# Patient Record
Sex: Male | Born: 1950 | Race: White | Hispanic: No | Smoking: Former smoker
Health system: Southern US, Community
[De-identification: ages and names within clinical notes are randomized; demographics above are authoritative.]

## PROBLEM LIST (undated history)

## (undated) DIAGNOSIS — S5292XA Unspecified fracture of left forearm, initial encounter for closed fracture: Secondary | ICD-10-CM

## (undated) DIAGNOSIS — K219 Gastro-esophageal reflux disease without esophagitis: Secondary | ICD-10-CM

## (undated) DIAGNOSIS — N200 Calculus of kidney: Secondary | ICD-10-CM

## (undated) DIAGNOSIS — C801 Malignant (primary) neoplasm, unspecified: Secondary | ICD-10-CM

## (undated) DIAGNOSIS — F32A Depression, unspecified: Secondary | ICD-10-CM

## (undated) DIAGNOSIS — M1990 Unspecified osteoarthritis, unspecified site: Secondary | ICD-10-CM

## (undated) DIAGNOSIS — Z87442 Personal history of urinary calculi: Secondary | ICD-10-CM

## (undated) DIAGNOSIS — F329 Major depressive disorder, single episode, unspecified: Secondary | ICD-10-CM

## (undated) HISTORY — PX: TONSILLECTOMY: SUR1361

## (undated) HISTORY — PX: COLONOSCOPY: SHX174

## (undated) HISTORY — DX: Gastro-esophageal reflux disease without esophagitis: K21.9

## (undated) HISTORY — PX: UPPER GI ENDOSCOPY: SHX6162

## (undated) HISTORY — PX: APPENDECTOMY: SHX54

---

## 1999-04-03 HISTORY — PX: LITHOTRIPSY: SUR834

## 1999-04-03 HISTORY — PX: CYSTOSCOPY W/ URETERAL STENT PLACEMENT: SHX1429

## 1999-04-12 ENCOUNTER — Encounter: Payer: Self-pay | Admitting: Urology

## 1999-04-12 ENCOUNTER — Ambulatory Visit (HOSPITAL_COMMUNITY): Admission: RE | Admit: 1999-04-12 | Discharge: 1999-04-13 | Payer: Self-pay | Admitting: Urology

## 1999-04-12 ENCOUNTER — Emergency Department (HOSPITAL_COMMUNITY): Admission: EM | Admit: 1999-04-12 | Discharge: 1999-04-12 | Payer: Self-pay | Admitting: Emergency Medicine

## 1999-04-12 ENCOUNTER — Encounter: Payer: Self-pay | Admitting: Emergency Medicine

## 1999-04-14 ENCOUNTER — Encounter: Payer: Self-pay | Admitting: Urology

## 1999-04-14 ENCOUNTER — Ambulatory Visit (HOSPITAL_COMMUNITY): Admission: RE | Admit: 1999-04-14 | Discharge: 1999-04-14 | Payer: Self-pay | Admitting: Urology

## 1999-04-15 ENCOUNTER — Emergency Department (HOSPITAL_COMMUNITY): Admission: EM | Admit: 1999-04-15 | Discharge: 1999-04-15 | Payer: Self-pay | Admitting: Emergency Medicine

## 1999-04-15 ENCOUNTER — Encounter: Payer: Self-pay | Admitting: Emergency Medicine

## 2000-12-09 ENCOUNTER — Emergency Department (HOSPITAL_COMMUNITY): Admission: EM | Admit: 2000-12-09 | Discharge: 2000-12-09 | Payer: Self-pay | Admitting: Emergency Medicine

## 2002-03-02 ENCOUNTER — Emergency Department (HOSPITAL_COMMUNITY): Admission: EM | Admit: 2002-03-02 | Discharge: 2002-03-02 | Payer: Self-pay | Admitting: Emergency Medicine

## 2007-02-08 ENCOUNTER — Encounter: Admission: RE | Admit: 2007-02-08 | Discharge: 2007-02-08 | Payer: Self-pay | Admitting: Orthopedic Surgery

## 2007-02-20 ENCOUNTER — Ambulatory Visit: Payer: Self-pay | Admitting: Internal Medicine

## 2007-02-22 ENCOUNTER — Encounter: Admission: RE | Admit: 2007-02-22 | Discharge: 2007-02-22 | Payer: Self-pay | Admitting: Orthopedic Surgery

## 2007-02-26 LAB — COMPREHENSIVE METABOLIC PANEL
Albumin: 4.4 g/dL (ref 3.5–5.2)
BUN: 14 mg/dL (ref 6–23)
Calcium: 9.1 mg/dL (ref 8.4–10.5)
Chloride: 107 mEq/L (ref 96–112)
Glucose, Bld: 122 mg/dL — ABNORMAL HIGH (ref 70–99)
Potassium: 3.9 mEq/L (ref 3.5–5.3)
Sodium: 141 mEq/L (ref 135–145)
Total Protein: 7.2 g/dL (ref 6.0–8.3)

## 2007-02-26 LAB — CBC WITH DIFFERENTIAL/PLATELET
Basophils Absolute: 0 10*3/uL (ref 0.0–0.1)
Eosinophils Absolute: 0.1 10*3/uL (ref 0.0–0.5)
HGB: 14.8 g/dL (ref 13.0–17.1)
MONO#: 0.4 10*3/uL (ref 0.1–0.9)
NEUT#: 4.7 10*3/uL (ref 1.5–6.5)
RDW: 12.8 % (ref 11.2–14.6)
WBC: 7.1 10*3/uL (ref 4.0–10.0)
lymph#: 1.8 10*3/uL (ref 0.9–3.3)

## 2007-02-26 LAB — URIC ACID: Uric Acid, Serum: 4.2 mg/dL (ref 4.0–7.8)

## 2007-03-01 ENCOUNTER — Ambulatory Visit (HOSPITAL_COMMUNITY): Admission: RE | Admit: 2007-03-01 | Discharge: 2007-03-01 | Payer: Self-pay | Admitting: Internal Medicine

## 2007-03-11 ENCOUNTER — Ambulatory Visit: Payer: Self-pay | Admitting: Vascular Surgery

## 2007-03-28 ENCOUNTER — Ambulatory Visit: Payer: Self-pay | Admitting: Vascular Surgery

## 2007-04-03 ENCOUNTER — Ambulatory Visit (HOSPITAL_BASED_OUTPATIENT_CLINIC_OR_DEPARTMENT_OTHER): Admission: RE | Admit: 2007-04-03 | Discharge: 2007-04-03 | Payer: Self-pay | Admitting: General Surgery

## 2007-04-03 ENCOUNTER — Encounter (INDEPENDENT_AMBULATORY_CARE_PROVIDER_SITE_OTHER): Payer: Self-pay | Admitting: General Surgery

## 2007-04-03 HISTORY — PX: AXILLARY LYMPH NODE BIOPSY: SHX5737

## 2007-04-04 ENCOUNTER — Ambulatory Visit: Payer: Self-pay | Admitting: Internal Medicine

## 2007-12-09 ENCOUNTER — Emergency Department (HOSPITAL_COMMUNITY): Admission: EM | Admit: 2007-12-09 | Discharge: 2007-12-09 | Payer: Self-pay | Admitting: Emergency Medicine

## 2008-03-02 HISTORY — PX: AXILLARY LYMPH NODE BIOPSY: SHX5737

## 2008-03-11 ENCOUNTER — Encounter: Admission: RE | Admit: 2008-03-11 | Discharge: 2008-03-11 | Payer: Self-pay | Admitting: Gastroenterology

## 2008-03-18 ENCOUNTER — Encounter (INDEPENDENT_AMBULATORY_CARE_PROVIDER_SITE_OTHER): Payer: Self-pay | Admitting: General Surgery

## 2008-03-19 ENCOUNTER — Ambulatory Visit (HOSPITAL_COMMUNITY): Admission: RE | Admit: 2008-03-19 | Discharge: 2008-03-19 | Payer: Self-pay | Admitting: Orthopedic Surgery

## 2008-03-19 ENCOUNTER — Encounter (INDEPENDENT_AMBULATORY_CARE_PROVIDER_SITE_OTHER): Payer: Self-pay | Admitting: General Surgery

## 2008-04-02 ENCOUNTER — Ambulatory Visit: Payer: Self-pay | Admitting: Internal Medicine

## 2008-04-02 LAB — CBC WITH DIFFERENTIAL/PLATELET
BASO%: 0.3 % (ref 0.0–2.0)
Basophils Absolute: 0 10*3/uL (ref 0.0–0.1)
EOS%: 0.7 % (ref 0.0–7.0)
HCT: 37.2 % — ABNORMAL LOW (ref 38.4–49.9)
HGB: 12.6 g/dL — ABNORMAL LOW (ref 13.0–17.1)
MCH: 27.5 pg (ref 27.2–33.4)
MCHC: 33.9 g/dL (ref 32.0–36.0)
MCV: 81.2 fL (ref 79.3–98.0)
MONO%: 6.8 % (ref 0.0–14.0)
NEUT%: 69.1 % (ref 39.0–75.0)
RDW: 13 % (ref 11.0–14.6)

## 2008-04-02 LAB — COMPREHENSIVE METABOLIC PANEL
AST: 23 U/L (ref 0–37)
Alkaline Phosphatase: 61 U/L (ref 39–117)
BUN: 15 mg/dL (ref 6–23)
Glucose, Bld: 187 mg/dL — ABNORMAL HIGH (ref 70–99)
Total Bilirubin: 0.5 mg/dL (ref 0.3–1.2)

## 2008-04-08 LAB — CBC WITH DIFFERENTIAL/PLATELET
Basophils Absolute: 0 10*3/uL (ref 0.0–0.1)
Eosinophils Absolute: 0 10*3/uL (ref 0.0–0.5)
HCT: 35.4 % — ABNORMAL LOW (ref 38.4–49.9)
HGB: 12.4 g/dL — ABNORMAL LOW (ref 13.0–17.1)
LYMPH%: 29.4 % (ref 14.0–49.0)
MCV: 82 fL (ref 79.3–98.0)
MONO%: 7.5 % (ref 0.0–14.0)
NEUT#: 1.4 10*3/uL — ABNORMAL LOW (ref 1.5–6.5)
NEUT%: 61.8 % (ref 39.0–75.0)
Platelets: 21 10*3/uL — ABNORMAL LOW (ref 140–400)

## 2008-04-08 LAB — COMPREHENSIVE METABOLIC PANEL
BUN: 13 mg/dL (ref 6–23)
CO2: 21 mEq/L (ref 19–32)
Calcium: 9 mg/dL (ref 8.4–10.5)
Creatinine, Ser: 1.04 mg/dL (ref 0.40–1.50)
Glucose, Bld: 106 mg/dL — ABNORMAL HIGH (ref 70–99)
Total Bilirubin: 0.5 mg/dL (ref 0.3–1.2)

## 2008-04-08 LAB — LACTATE DEHYDROGENASE: LDH: 278 U/L — ABNORMAL HIGH (ref 94–250)

## 2008-04-15 LAB — COMPREHENSIVE METABOLIC PANEL
ALT: 8 U/L (ref 0–53)
AST: 15 U/L (ref 0–37)
Albumin: 3.9 g/dL (ref 3.5–5.2)
CO2: 22 mEq/L (ref 19–32)
Calcium: 9 mg/dL (ref 8.4–10.5)
Chloride: 104 mEq/L (ref 96–112)
Creatinine, Ser: 1.14 mg/dL (ref 0.40–1.50)
Potassium: 3.9 mEq/L (ref 3.5–5.3)

## 2008-04-15 LAB — CBC WITH DIFFERENTIAL/PLATELET
BASO%: 0.3 % (ref 0.0–2.0)
Basophils Absolute: 0 10*3/uL (ref 0.0–0.1)
HCT: 37.9 % — ABNORMAL LOW (ref 38.4–49.9)
HGB: 13.4 g/dL (ref 13.0–17.1)
MCHC: 35.4 g/dL (ref 32.0–36.0)
MONO#: 0.3 10*3/uL (ref 0.1–0.9)
NEUT%: 66.7 % (ref 39.0–75.0)
RDW: 12.9 % (ref 11.0–14.6)
WBC: 2.9 10*3/uL — ABNORMAL LOW (ref 4.0–10.3)
lymph#: 0.6 10*3/uL — ABNORMAL LOW (ref 0.9–3.3)

## 2008-04-15 LAB — LACTATE DEHYDROGENASE: LDH: 265 U/L — ABNORMAL HIGH (ref 94–250)

## 2008-04-21 LAB — COMPREHENSIVE METABOLIC PANEL
AST: 12 U/L (ref 0–37)
Alkaline Phosphatase: 50 U/L (ref 39–117)
BUN: 12 mg/dL (ref 6–23)
Creatinine, Ser: 1.03 mg/dL (ref 0.40–1.50)
Glucose, Bld: 101 mg/dL — ABNORMAL HIGH (ref 70–99)
Potassium: 4.1 mEq/L (ref 3.5–5.3)
Total Bilirubin: 0.4 mg/dL (ref 0.3–1.2)

## 2008-04-21 LAB — CBC WITH DIFFERENTIAL/PLATELET
BASO%: 0.8 % (ref 0.0–2.0)
Basophils Absolute: 0 10*3/uL (ref 0.0–0.1)
EOS%: 0.8 % (ref 0.0–7.0)
HGB: 13.3 g/dL (ref 13.0–17.1)
MCH: 27.9 pg (ref 27.2–33.4)
MCHC: 34.9 g/dL (ref 32.0–36.0)
MCV: 79.9 fL (ref 79.3–98.0)
MONO%: 10.2 % (ref 0.0–14.0)
RBC: 4.77 10*6/uL (ref 4.20–5.82)
RDW: 12.9 % (ref 11.0–14.6)
lymph#: 0.7 10*3/uL — ABNORMAL LOW (ref 0.9–3.3)
nRBC: 0 % (ref 0–0)

## 2008-04-29 LAB — CBC WITH DIFFERENTIAL/PLATELET
BASO%: 0.5 % (ref 0.0–2.0)
LYMPH%: 17.9 % (ref 14.0–49.0)
MCHC: 35.3 g/dL (ref 32.0–36.0)
MONO#: 0.5 10*3/uL (ref 0.1–0.9)
NEUT#: 4 10*3/uL (ref 1.5–6.5)
Platelets: 87 10*3/uL — ABNORMAL LOW (ref 140–400)
RBC: 5.02 10*6/uL (ref 4.20–5.82)
RDW: 13.4 % (ref 11.0–14.6)
WBC: 5.5 10*3/uL (ref 4.0–10.3)

## 2008-04-29 LAB — COMPREHENSIVE METABOLIC PANEL
Albumin: 4 g/dL (ref 3.5–5.2)
Alkaline Phosphatase: 53 U/L (ref 39–117)
BUN: 11 mg/dL (ref 6–23)
CO2: 22 mEq/L (ref 19–32)
Calcium: 9.1 mg/dL (ref 8.4–10.5)
Chloride: 106 mEq/L (ref 96–112)
Glucose, Bld: 104 mg/dL — ABNORMAL HIGH (ref 70–99)
Potassium: 3.8 mEq/L (ref 3.5–5.3)
Sodium: 138 mEq/L (ref 135–145)
Total Protein: 6.4 g/dL (ref 6.0–8.3)

## 2008-04-29 LAB — LACTATE DEHYDROGENASE: LDH: 188 U/L (ref 94–250)

## 2008-05-07 ENCOUNTER — Ambulatory Visit: Admission: RE | Admit: 2008-05-07 | Discharge: 2008-05-07 | Payer: Self-pay | Admitting: Internal Medicine

## 2008-05-07 ENCOUNTER — Encounter: Payer: Self-pay | Admitting: Internal Medicine

## 2008-05-11 LAB — CBC WITH DIFFERENTIAL/PLATELET
BASO%: 1 % (ref 0.0–2.0)
Basophils Absolute: 0 10*3/uL (ref 0.0–0.1)
EOS%: 1.3 % (ref 0.0–7.0)
Eosinophils Absolute: 0 10*3/uL (ref 0.0–0.5)
HCT: 37.3 % — ABNORMAL LOW (ref 38.4–49.9)
HGB: 13.1 g/dL (ref 13.0–17.1)
LYMPH%: 25.9 % (ref 14.0–49.0)
MCH: 28.2 pg (ref 27.2–33.4)
MCHC: 35.1 g/dL (ref 32.0–36.0)
MCV: 80.2 fL (ref 79.3–98.0)
MONO#: 0.4 10*3/uL (ref 0.1–0.9)
MONO%: 12.3 % (ref 0.0–14.0)
NEUT#: 1.8 10*3/uL (ref 1.5–6.5)
NEUT%: 59.5 % (ref 39.0–75.0)
Platelets: 66 10*3/uL — ABNORMAL LOW (ref 140–400)
RBC: 4.65 10*6/uL (ref 4.20–5.82)
RDW: 13.8 % (ref 11.0–14.6)
WBC: 3.1 10*3/uL — ABNORMAL LOW (ref 4.0–10.3)
lymph#: 0.8 10*3/uL — ABNORMAL LOW (ref 0.9–3.3)
nRBC: 0 % (ref 0–0)

## 2008-05-11 LAB — COMPREHENSIVE METABOLIC PANEL
ALT: 8 U/L (ref 0–53)
AST: 16 U/L (ref 0–37)
Albumin: 3.8 g/dL (ref 3.5–5.2)
Calcium: 9.1 mg/dL (ref 8.4–10.5)
Chloride: 107 mEq/L (ref 96–112)
Potassium: 3.8 mEq/L (ref 3.5–5.3)
Sodium: 141 mEq/L (ref 135–145)

## 2008-05-18 ENCOUNTER — Ambulatory Visit: Payer: Self-pay | Admitting: Internal Medicine

## 2008-05-18 LAB — COMPREHENSIVE METABOLIC PANEL
ALT: 14 U/L (ref 0–53)
AST: 14 U/L (ref 0–37)
Calcium: 9.4 mg/dL (ref 8.4–10.5)
Chloride: 101 mEq/L (ref 96–112)
Creatinine, Ser: 0.97 mg/dL (ref 0.40–1.50)
Potassium: 3.6 mEq/L (ref 3.5–5.3)
Sodium: 137 mEq/L (ref 135–145)
Total Protein: 6.5 g/dL (ref 6.0–8.3)

## 2008-05-18 LAB — CBC WITH DIFFERENTIAL/PLATELET
BASO%: 1.6 % (ref 0.0–2.0)
EOS%: 10.3 % — ABNORMAL HIGH (ref 0.0–7.0)
MCH: 28.4 pg (ref 27.2–33.4)
MCHC: 34.8 g/dL (ref 32.0–36.0)
NEUT%: 12.7 % — ABNORMAL LOW (ref 39.0–75.0)
RBC: 4.55 10*6/uL (ref 4.20–5.82)
RDW: 13.6 % (ref 11.0–14.6)
WBC: 0.7 10*3/uL — CL (ref 4.0–10.3)
lymph#: 0.4 10*3/uL — ABNORMAL LOW (ref 0.9–3.3)

## 2008-05-18 LAB — URINALYSIS, MICROSCOPIC - CHCC
Ketones: NEGATIVE mg/dL
Protein: 30 mg/dL
pH: 7 (ref 4.6–8.0)

## 2008-05-25 LAB — COMPREHENSIVE METABOLIC PANEL
ALT: 11 U/L (ref 0–53)
AST: 14 U/L (ref 0–37)
CO2: 23 mEq/L (ref 19–32)
Calcium: 8.8 mg/dL (ref 8.4–10.5)
Chloride: 105 mEq/L (ref 96–112)
Creatinine, Ser: 1.08 mg/dL (ref 0.40–1.50)
Sodium: 136 mEq/L (ref 135–145)
Total Protein: 6.2 g/dL (ref 6.0–8.3)

## 2008-05-25 LAB — CBC WITH DIFFERENTIAL/PLATELET
Basophils Absolute: 0 10*3/uL (ref 0.0–0.1)
HCT: 31.5 % — ABNORMAL LOW (ref 38.4–49.9)
HGB: 11.1 g/dL — ABNORMAL LOW (ref 13.0–17.1)
MCH: 28.6 pg (ref 27.2–33.4)
MONO#: 0.6 10*3/uL (ref 0.1–0.9)
NEUT%: 76.2 % — ABNORMAL HIGH (ref 39.0–75.0)
WBC: 8.6 10*3/uL (ref 4.0–10.3)
lymph#: 1.4 10*3/uL (ref 0.9–3.3)

## 2008-05-25 LAB — LACTATE DEHYDROGENASE: LDH: 177 U/L (ref 94–250)

## 2008-06-02 LAB — CBC WITH DIFFERENTIAL/PLATELET
Basophils Absolute: 0 10*3/uL (ref 0.0–0.1)
Eosinophils Absolute: 0 10*3/uL (ref 0.0–0.5)
HCT: 32.5 % — ABNORMAL LOW (ref 38.4–49.9)
HGB: 11.6 g/dL — ABNORMAL LOW (ref 13.0–17.1)
LYMPH%: 18.9 % (ref 14.0–49.0)
MCHC: 35.7 g/dL (ref 32.0–36.0)
MONO#: 0.6 10*3/uL (ref 0.1–0.9)
NEUT#: 4.1 10*3/uL (ref 1.5–6.5)
NEUT%: 69.7 % (ref 39.0–75.0)
Platelets: 155 10*3/uL (ref 140–400)
WBC: 5.9 10*3/uL (ref 4.0–10.3)
lymph#: 1.1 10*3/uL (ref 0.9–3.3)

## 2008-06-10 LAB — CBC WITH DIFFERENTIAL/PLATELET
BASO%: 1.9 % (ref 0.0–2.0)
Basophils Absolute: 0 10*3/uL (ref 0.0–0.1)
EOS%: 5.7 % (ref 0.0–7.0)
HCT: 31.6 % — ABNORMAL LOW (ref 38.4–49.9)
HGB: 11.7 g/dL — ABNORMAL LOW (ref 13.0–17.1)
LYMPH%: 68.9 % — ABNORMAL HIGH (ref 14.0–49.0)
MCH: 30.4 pg (ref 27.2–33.4)
MCHC: 36.9 g/dL — ABNORMAL HIGH (ref 32.0–36.0)
MCV: 82.5 fL (ref 79.3–98.0)
NEUT%: 11 % — ABNORMAL LOW (ref 39.0–75.0)
Platelets: 60 10*3/uL — ABNORMAL LOW (ref 140–400)
lymph#: 0.6 10*3/uL — ABNORMAL LOW (ref 0.9–3.3)

## 2008-06-10 LAB — COMPREHENSIVE METABOLIC PANEL
ALT: 18 U/L (ref 0–53)
AST: 11 U/L (ref 0–37)
BUN: 12 mg/dL (ref 6–23)
CO2: 27 mEq/L (ref 19–32)
Calcium: 8.9 mg/dL (ref 8.4–10.5)
Chloride: 104 mEq/L (ref 96–112)
Creatinine, Ser: 0.89 mg/dL (ref 0.40–1.50)
Total Bilirubin: 0.6 mg/dL (ref 0.3–1.2)

## 2008-06-10 LAB — LACTATE DEHYDROGENASE: LDH: 100 U/L (ref 94–250)

## 2008-06-17 LAB — CBC WITH DIFFERENTIAL/PLATELET
BASO%: 0.6 % (ref 0.0–2.0)
Basophils Absolute: 0 10*3/uL (ref 0.0–0.1)
HCT: 33.3 % — ABNORMAL LOW (ref 38.4–49.9)
HGB: 12.2 g/dL — ABNORMAL LOW (ref 13.0–17.1)
LYMPH%: 27.1 % (ref 14.0–49.0)
MCH: 30.9 pg (ref 27.2–33.4)
MCHC: 36.8 g/dL — ABNORMAL HIGH (ref 32.0–36.0)
MONO#: 0.7 10*3/uL (ref 0.1–0.9)
NEUT%: 59.6 % (ref 39.0–75.0)
Platelets: 135 10*3/uL — ABNORMAL LOW (ref 140–400)
WBC: 5.6 10*3/uL (ref 4.0–10.3)
lymph#: 1.5 10*3/uL (ref 0.9–3.3)

## 2008-06-17 LAB — LACTATE DEHYDROGENASE: LDH: 142 U/L (ref 94–250)

## 2008-06-17 LAB — COMPREHENSIVE METABOLIC PANEL
ALT: 18 U/L (ref 0–53)
BUN: 4 mg/dL — ABNORMAL LOW (ref 6–23)
CO2: 26 mEq/L (ref 19–32)
Calcium: 9 mg/dL (ref 8.4–10.5)
Chloride: 105 mEq/L (ref 96–112)
Creatinine, Ser: 1.04 mg/dL (ref 0.40–1.50)
Total Bilirubin: 0.6 mg/dL (ref 0.3–1.2)

## 2008-06-22 LAB — CBC WITH DIFFERENTIAL/PLATELET
Basophils Absolute: 0 10*3/uL (ref 0.0–0.1)
EOS%: 0.4 % (ref 0.0–7.0)
Eosinophils Absolute: 0 10*3/uL (ref 0.0–0.5)
HCT: 33.4 % — ABNORMAL LOW (ref 38.4–49.9)
HGB: 11.9 g/dL — ABNORMAL LOW (ref 13.0–17.1)
MONO#: 0.6 10*3/uL (ref 0.1–0.9)
NEUT#: 5.8 10*3/uL (ref 1.5–6.5)
NEUT%: 78.6 % — ABNORMAL HIGH (ref 39.0–75.0)
RDW: 18.1 % — ABNORMAL HIGH (ref 11.0–14.6)
WBC: 7.3 10*3/uL (ref 4.0–10.3)
lymph#: 0.9 10*3/uL (ref 0.9–3.3)

## 2008-06-22 LAB — COMPREHENSIVE METABOLIC PANEL
AST: 15 U/L (ref 0–37)
Albumin: 3.6 g/dL (ref 3.5–5.2)
BUN: 5 mg/dL — ABNORMAL LOW (ref 6–23)
CO2: 25 mEq/L (ref 19–32)
Calcium: 9.1 mg/dL (ref 8.4–10.5)
Chloride: 105 mEq/L (ref 96–112)
Creatinine, Ser: 0.95 mg/dL (ref 0.40–1.50)
Glucose, Bld: 118 mg/dL — ABNORMAL HIGH (ref 70–99)
Potassium: 3.7 mEq/L (ref 3.5–5.3)

## 2008-06-22 LAB — LACTATE DEHYDROGENASE: LDH: 147 U/L (ref 94–250)

## 2008-06-29 LAB — CBC WITH DIFFERENTIAL/PLATELET
Basophils Absolute: 0 10*3/uL (ref 0.0–0.1)
EOS%: 4.3 % (ref 0.0–7.0)
Eosinophils Absolute: 0 10*3/uL (ref 0.0–0.5)
HCT: 28.6 % — ABNORMAL LOW (ref 38.4–49.9)
HGB: 10.6 g/dL — ABNORMAL LOW (ref 13.0–17.1)
MCH: 31.4 pg (ref 27.2–33.4)
MONO#: 0.1 10*3/uL (ref 0.1–0.9)
NEUT%: 10.7 % — ABNORMAL LOW (ref 39.0–75.0)
lymph#: 0.5 10*3/uL — ABNORMAL LOW (ref 0.9–3.3)

## 2008-06-29 LAB — COMPREHENSIVE METABOLIC PANEL
BUN: 8 mg/dL (ref 6–23)
CO2: 26 mEq/L (ref 19–32)
Calcium: 9 mg/dL (ref 8.4–10.5)
Chloride: 103 mEq/L (ref 96–112)
Creatinine, Ser: 0.83 mg/dL (ref 0.40–1.50)
Glucose, Bld: 108 mg/dL — ABNORMAL HIGH (ref 70–99)

## 2008-06-29 LAB — LACTATE DEHYDROGENASE: LDH: 110 U/L (ref 94–250)

## 2008-07-07 ENCOUNTER — Ambulatory Visit (HOSPITAL_COMMUNITY): Admission: RE | Admit: 2008-07-07 | Discharge: 2008-07-07 | Payer: Self-pay | Admitting: Internal Medicine

## 2008-07-07 ENCOUNTER — Ambulatory Visit: Payer: Self-pay | Admitting: Internal Medicine

## 2008-07-09 LAB — COMPREHENSIVE METABOLIC PANEL
ALT: 9 U/L (ref 0–53)
AST: 12 U/L (ref 0–37)
Albumin: 3.8 g/dL (ref 3.5–5.2)
CO2: 26 mEq/L (ref 19–32)
Calcium: 9.3 mg/dL (ref 8.4–10.5)
Chloride: 106 mEq/L (ref 96–112)
Potassium: 4.3 mEq/L (ref 3.5–5.3)
Sodium: 141 mEq/L (ref 135–145)
Total Protein: 6.3 g/dL (ref 6.0–8.3)

## 2008-07-09 LAB — CBC WITH DIFFERENTIAL/PLATELET
BASO%: 0.2 % (ref 0.0–2.0)
EOS%: 0.7 % (ref 0.0–7.0)
HCT: 31.4 % — ABNORMAL LOW (ref 38.4–49.9)
MCH: 31.6 pg (ref 27.2–33.4)
MCHC: 35.8 g/dL (ref 32.0–36.0)
MONO#: 0.5 10*3/uL (ref 0.1–0.9)
NEUT%: 76.3 % — ABNORMAL HIGH (ref 39.0–75.0)
RDW: 19.6 % — ABNORMAL HIGH (ref 11.0–14.6)
WBC: 5.6 10*3/uL (ref 4.0–10.3)
lymph#: 0.8 10*3/uL — ABNORMAL LOW (ref 0.9–3.3)

## 2008-07-09 LAB — LACTATE DEHYDROGENASE: LDH: 150 U/L (ref 94–250)

## 2008-07-13 LAB — COMPREHENSIVE METABOLIC PANEL
ALT: 10 U/L (ref 0–53)
AST: 15 U/L (ref 0–37)
Albumin: 4.1 g/dL (ref 3.5–5.2)
Alkaline Phosphatase: 54 U/L (ref 39–117)
BUN: 8 mg/dL (ref 6–23)
Creatinine, Ser: 1.01 mg/dL (ref 0.40–1.50)
Potassium: 3.8 mEq/L (ref 3.5–5.3)

## 2008-07-13 LAB — CBC WITH DIFFERENTIAL/PLATELET
BASO%: 0 % (ref 0.0–2.0)
Basophils Absolute: 0 10*3/uL (ref 0.0–0.1)
EOS%: 0.2 % (ref 0.0–7.0)
HGB: 11.7 g/dL — ABNORMAL LOW (ref 13.0–17.1)
MCH: 31.6 pg (ref 27.2–33.4)
MCHC: 35.6 g/dL (ref 32.0–36.0)
MCV: 88.6 fL (ref 79.3–98.0)
MONO%: 7.5 % (ref 0.0–14.0)
RBC: 3.71 10*6/uL — ABNORMAL LOW (ref 4.20–5.82)
RDW: 19.7 % — ABNORMAL HIGH (ref 11.0–14.6)
lymph#: 0.8 10*3/uL — ABNORMAL LOW (ref 0.9–3.3)

## 2008-07-13 LAB — LACTATE DEHYDROGENASE: LDH: 158 U/L (ref 94–250)

## 2008-07-20 LAB — COMPREHENSIVE METABOLIC PANEL
ALT: 43 U/L (ref 0–53)
AST: 17 U/L (ref 0–37)
Albumin: 3.9 g/dL (ref 3.5–5.2)
CO2: 27 mEq/L (ref 19–32)
Calcium: 9 mg/dL (ref 8.4–10.5)
Chloride: 103 mEq/L (ref 96–112)
Creatinine, Ser: 0.85 mg/dL (ref 0.40–1.50)
Potassium: 3.1 mEq/L — ABNORMAL LOW (ref 3.5–5.3)
Total Protein: 6.1 g/dL (ref 6.0–8.3)

## 2008-07-20 LAB — CBC WITH DIFFERENTIAL/PLATELET
BASO%: 1.6 % (ref 0.0–2.0)
EOS%: 4.9 % (ref 0.0–7.0)
HCT: 25.8 % — ABNORMAL LOW (ref 38.4–49.9)
HGB: 9.2 g/dL — ABNORMAL LOW (ref 13.0–17.1)
MCH: 30.6 pg (ref 27.2–33.4)
MCHC: 35.7 g/dL (ref 32.0–36.0)
MONO#: 0.1 10*3/uL (ref 0.1–0.9)
NEUT%: 16.5 % — ABNORMAL LOW (ref 39.0–75.0)
RDW: 15.6 % — ABNORMAL HIGH (ref 11.0–14.6)
WBC: 0.6 10*3/uL — CL (ref 4.0–10.3)
lymph#: 0.4 10*3/uL — ABNORMAL LOW (ref 0.9–3.3)

## 2008-07-20 LAB — LACTATE DEHYDROGENASE: LDH: 109 U/L (ref 94–250)

## 2008-07-27 LAB — CBC WITH DIFFERENTIAL/PLATELET
BASO%: 0.8 % (ref 0.0–2.0)
Basophils Absolute: 0 10*3/uL (ref 0.0–0.1)
EOS%: 0.5 % (ref 0.0–7.0)
MCH: 32.2 pg (ref 27.2–33.4)
MCHC: 35.5 g/dL (ref 32.0–36.0)
MCV: 90.6 fL (ref 79.3–98.0)
MONO%: 11.9 % (ref 0.0–14.0)
RBC: 3.32 10*6/uL — ABNORMAL LOW (ref 4.20–5.82)
RDW: 17.1 % — ABNORMAL HIGH (ref 11.0–14.6)
lymph#: 1 10*3/uL (ref 0.9–3.3)

## 2008-07-27 LAB — COMPREHENSIVE METABOLIC PANEL
ALT: 14 U/L (ref 0–53)
AST: 13 U/L (ref 0–37)
Albumin: 4.2 g/dL (ref 3.5–5.2)
Alkaline Phosphatase: 61 U/L (ref 39–117)
BUN: 6 mg/dL (ref 6–23)
Chloride: 107 mEq/L (ref 96–112)
Potassium: 3.9 mEq/L (ref 3.5–5.3)
Sodium: 140 mEq/L (ref 135–145)

## 2008-07-27 LAB — LACTATE DEHYDROGENASE: LDH: 163 U/L (ref 94–250)

## 2008-08-03 ENCOUNTER — Ambulatory Visit: Payer: Self-pay | Admitting: Internal Medicine

## 2008-08-03 LAB — CBC WITH DIFFERENTIAL/PLATELET
BASO%: 0.7 % (ref 0.0–2.0)
EOS%: 0.2 % (ref 0.0–7.0)
HCT: 31.4 % — ABNORMAL LOW (ref 38.4–49.9)
MCH: 31.3 pg (ref 27.2–33.4)
MCHC: 35.4 g/dL (ref 32.0–36.0)
MONO%: 13.6 % (ref 0.0–14.0)
NEUT%: 72.1 % (ref 39.0–75.0)
lymph#: 0.8 10*3/uL — ABNORMAL LOW (ref 0.9–3.3)

## 2008-08-03 LAB — COMPREHENSIVE METABOLIC PANEL
ALT: 11 U/L (ref 0–53)
AST: 12 U/L (ref 0–37)
Alkaline Phosphatase: 59 U/L (ref 39–117)
Calcium: 9.6 mg/dL (ref 8.4–10.5)
Chloride: 107 mEq/L (ref 96–112)
Creatinine, Ser: 0.83 mg/dL (ref 0.40–1.50)
Total Bilirubin: 0.3 mg/dL (ref 0.3–1.2)

## 2008-08-10 LAB — LACTATE DEHYDROGENASE: LDH: 137 U/L (ref 94–250)

## 2008-08-10 LAB — CBC WITH DIFFERENTIAL/PLATELET
Basophils Absolute: 0 10*3/uL (ref 0.0–0.1)
EOS%: 5.2 % (ref 0.0–7.0)
HCT: 25.5 % — ABNORMAL LOW (ref 38.4–49.9)
HGB: 8.9 g/dL — ABNORMAL LOW (ref 13.0–17.1)
LYMPH%: 75.9 % — ABNORMAL HIGH (ref 14.0–49.0)
MCH: 31 pg (ref 27.2–33.4)
MCV: 88.9 fL (ref 79.3–98.0)
MONO%: 13.8 % (ref 0.0–14.0)
NEUT%: 1.7 % — ABNORMAL LOW (ref 39.0–75.0)
Platelets: 37 10*3/uL — ABNORMAL LOW (ref 140–400)

## 2008-08-10 LAB — COMPREHENSIVE METABOLIC PANEL
ALT: 28 U/L (ref 0–53)
Albumin: 3.8 g/dL (ref 3.5–5.2)
CO2: 26 mEq/L (ref 19–32)
Chloride: 103 mEq/L (ref 96–112)
Glucose, Bld: 77 mg/dL (ref 70–99)
Potassium: 3.5 mEq/L (ref 3.5–5.3)
Sodium: 137 mEq/L (ref 135–145)
Total Bilirubin: 0.5 mg/dL (ref 0.3–1.2)
Total Protein: 5.6 g/dL — ABNORMAL LOW (ref 6.0–8.3)

## 2008-08-17 LAB — CBC WITH DIFFERENTIAL/PLATELET
BASO%: 0.8 % (ref 0.0–2.0)
EOS%: 0.7 % (ref 0.0–7.0)
HCT: 29.8 % — ABNORMAL LOW (ref 38.4–49.9)
LYMPH%: 19.8 % (ref 14.0–49.0)
MCH: 32.7 pg (ref 27.2–33.4)
MCHC: 35.4 g/dL (ref 32.0–36.0)
MONO#: 0.6 10*3/uL (ref 0.1–0.9)
NEUT%: 65.5 % (ref 39.0–75.0)
Platelets: 131 10*3/uL — ABNORMAL LOW (ref 140–400)
RBC: 3.22 10*6/uL — ABNORMAL LOW (ref 4.20–5.82)
WBC: 4.7 10*3/uL (ref 4.0–10.3)
lymph#: 0.9 10*3/uL (ref 0.9–3.3)

## 2008-08-17 LAB — COMPREHENSIVE METABOLIC PANEL
ALT: 12 U/L (ref 0–53)
AST: 16 U/L (ref 0–37)
Creatinine, Ser: 0.92 mg/dL (ref 0.40–1.50)
Sodium: 141 mEq/L (ref 135–145)
Total Bilirubin: 0.3 mg/dL (ref 0.3–1.2)
Total Protein: 6.1 g/dL (ref 6.0–8.3)

## 2008-08-24 LAB — CBC WITH DIFFERENTIAL/PLATELET
BASO%: 0.2 % (ref 0.0–2.0)
EOS%: 0.3 % (ref 0.0–7.0)
HGB: 11.6 g/dL — ABNORMAL LOW (ref 13.0–17.1)
MCH: 32.7 pg (ref 27.2–33.4)
MCHC: 35 g/dL (ref 32.0–36.0)
MCV: 93.3 fL (ref 79.3–98.0)
MONO%: 9.1 % (ref 0.0–14.0)
RBC: 3.56 10*6/uL — ABNORMAL LOW (ref 4.20–5.82)
RDW: 14.8 % — ABNORMAL HIGH (ref 11.0–14.6)
lymph#: 0.8 10*3/uL — ABNORMAL LOW (ref 0.9–3.3)

## 2008-08-24 LAB — COMPREHENSIVE METABOLIC PANEL
ALT: 8 U/L (ref 0–53)
AST: 15 U/L (ref 0–37)
Albumin: 3.7 g/dL (ref 3.5–5.2)
Alkaline Phosphatase: 57 U/L (ref 39–117)
Calcium: 9.2 mg/dL (ref 8.4–10.5)
Chloride: 107 mEq/L (ref 96–112)
Potassium: 3.9 mEq/L (ref 3.5–5.3)
Sodium: 142 mEq/L (ref 135–145)

## 2008-08-31 LAB — LACTATE DEHYDROGENASE: LDH: 99 U/L (ref 94–250)

## 2008-08-31 LAB — CBC WITH DIFFERENTIAL/PLATELET
BASO%: 0.2 % (ref 0.0–2.0)
Basophils Absolute: 0 10e3/uL (ref 0.0–0.1)
EOS%: 4.4 % (ref 0.0–7.0)
Eosinophils Absolute: 0 10e3/uL (ref 0.0–0.5)
HCT: 25.8 % — ABNORMAL LOW (ref 38.4–49.9)
HGB: 9.4 g/dL — ABNORMAL LOW (ref 13.0–17.1)
LYMPH%: 71.7 % — ABNORMAL HIGH (ref 14.0–49.0)
MCH: 33.1 pg (ref 27.2–33.4)
MCHC: 36.3 g/dL — ABNORMAL HIGH (ref 32.0–36.0)
MCV: 91.2 fL (ref 79.3–98.0)
MONO#: 0.1 10e3/uL (ref 0.1–0.9)
MONO%: 18.3 % — ABNORMAL HIGH (ref 0.0–14.0)
NEUT#: 0 10e3/uL — CL (ref 1.5–6.5)
NEUT%: 5.4 % — ABNORMAL LOW (ref 39.0–75.0)
Platelets: 25 10e3/uL — ABNORMAL LOW (ref 140–400)
RBC: 2.83 10e6/uL — ABNORMAL LOW (ref 4.20–5.82)
RDW: 13.1 % (ref 11.0–14.6)
WBC: 0.7 10e3/uL — CL (ref 4.0–10.3)
lymph#: 0.5 10e3/uL — ABNORMAL LOW (ref 0.9–3.3)

## 2008-08-31 LAB — COMPREHENSIVE METABOLIC PANEL
AST: 9 U/L (ref 0–37)
Alkaline Phosphatase: 54 U/L (ref 39–117)
BUN: 9 mg/dL (ref 6–23)
Glucose, Bld: 106 mg/dL — ABNORMAL HIGH (ref 70–99)
Potassium: 3.8 mEq/L (ref 3.5–5.3)
Total Bilirubin: 0.6 mg/dL (ref 0.3–1.2)

## 2008-09-03 ENCOUNTER — Ambulatory Visit: Payer: Self-pay | Admitting: Internal Medicine

## 2008-09-08 LAB — CBC WITH DIFFERENTIAL/PLATELET
Eosinophils Absolute: 0 10*3/uL (ref 0.0–0.5)
HCT: 29.4 % — ABNORMAL LOW (ref 38.4–49.9)
LYMPH%: 12.8 % — ABNORMAL LOW (ref 14.0–49.0)
MONO#: 0.5 10*3/uL (ref 0.1–0.9)
NEUT#: 3.8 10*3/uL (ref 1.5–6.5)
Platelets: 115 10*3/uL — ABNORMAL LOW (ref 140–400)
RBC: 3.17 10*6/uL — ABNORMAL LOW (ref 4.20–5.82)
WBC: 5 10*3/uL (ref 4.0–10.3)
lymph#: 0.6 10*3/uL — ABNORMAL LOW (ref 0.9–3.3)

## 2008-09-08 LAB — COMPREHENSIVE METABOLIC PANEL
Albumin: 3.7 g/dL (ref 3.5–5.2)
CO2: 22 mEq/L (ref 19–32)
Calcium: 8.6 mg/dL (ref 8.4–10.5)
Chloride: 109 mEq/L (ref 96–112)
Glucose, Bld: 103 mg/dL — ABNORMAL HIGH (ref 70–99)
Sodium: 140 mEq/L (ref 135–145)
Total Bilirubin: 0.3 mg/dL (ref 0.3–1.2)
Total Protein: 5.9 g/dL — ABNORMAL LOW (ref 6.0–8.3)

## 2008-09-10 ENCOUNTER — Ambulatory Visit (HOSPITAL_COMMUNITY): Admission: RE | Admit: 2008-09-10 | Discharge: 2008-09-10 | Payer: Self-pay | Admitting: Internal Medicine

## 2008-09-14 LAB — COMPREHENSIVE METABOLIC PANEL
ALT: 8 U/L (ref 0–53)
Albumin: 4 g/dL (ref 3.5–5.2)
CO2: 25 mEq/L (ref 19–32)
Chloride: 108 mEq/L (ref 96–112)
Glucose, Bld: 104 mg/dL — ABNORMAL HIGH (ref 70–99)
Potassium: 3.9 mEq/L (ref 3.5–5.3)
Sodium: 141 mEq/L (ref 135–145)
Total Protein: 6.1 g/dL (ref 6.0–8.3)

## 2008-09-14 LAB — CBC WITH DIFFERENTIAL/PLATELET
BASO%: 0.7 % (ref 0.0–2.0)
Eosinophils Absolute: 0 10*3/uL (ref 0.0–0.5)
MCHC: 35.5 g/dL (ref 32.0–36.0)
MONO#: 0.6 10*3/uL (ref 0.1–0.9)
NEUT#: 3 10*3/uL (ref 1.5–6.5)
Platelets: 141 10*3/uL (ref 140–400)
RBC: 3.41 10*6/uL — ABNORMAL LOW (ref 4.20–5.82)
RDW: 15 % — ABNORMAL HIGH (ref 11.0–14.6)
WBC: 4.5 10*3/uL (ref 4.0–10.3)
lymph#: 0.9 10*3/uL (ref 0.9–3.3)

## 2008-09-22 LAB — COMPREHENSIVE METABOLIC PANEL
AST: 9 U/L (ref 0–37)
Albumin: 4.1 g/dL (ref 3.5–5.2)
Alkaline Phosphatase: 61 U/L (ref 39–117)
Glucose, Bld: 115 mg/dL — ABNORMAL HIGH (ref 70–99)
Potassium: 3.5 mEq/L (ref 3.5–5.3)
Sodium: 140 mEq/L (ref 135–145)
Total Bilirubin: 0.7 mg/dL (ref 0.3–1.2)
Total Protein: 6.3 g/dL (ref 6.0–8.3)

## 2008-09-22 LAB — CBC WITH DIFFERENTIAL/PLATELET
EOS%: 4.3 % (ref 0.0–7.0)
LYMPH%: 68 % — ABNORMAL HIGH (ref 14.0–49.0)
MCH: 32.9 pg (ref 27.2–33.4)
MCHC: 35.8 g/dL (ref 32.0–36.0)
MCV: 92.2 fL (ref 79.3–98.0)
MONO%: 18.3 % — ABNORMAL HIGH (ref 0.0–14.0)
Platelets: 34 10*3/uL — ABNORMAL LOW (ref 140–400)
RBC: 2.93 10*6/uL — ABNORMAL LOW (ref 4.20–5.82)
RDW: 13.8 % (ref 11.0–14.6)

## 2008-09-30 LAB — COMPREHENSIVE METABOLIC PANEL
ALT: 9 U/L (ref 0–53)
CO2: 26 mEq/L (ref 19–32)
Calcium: 8.9 mg/dL (ref 8.4–10.5)
Chloride: 107 mEq/L (ref 96–112)
Sodium: 140 mEq/L (ref 135–145)
Total Protein: 6.1 g/dL (ref 6.0–8.3)

## 2008-09-30 LAB — CBC WITH DIFFERENTIAL/PLATELET
Eosinophils Absolute: 0 10*3/uL (ref 0.0–0.5)
HCT: 32.1 % — ABNORMAL LOW (ref 38.4–49.9)
LYMPH%: 18.7 % (ref 14.0–49.0)
MCV: 92.7 fL (ref 79.3–98.0)
MONO#: 0.6 10*3/uL (ref 0.1–0.9)
MONO%: 14.6 % — ABNORMAL HIGH (ref 0.0–14.0)
NEUT#: 2.7 10*3/uL (ref 1.5–6.5)
NEUT%: 65.5 % (ref 39.0–75.0)
Platelets: 122 10*3/uL — ABNORMAL LOW (ref 140–400)
RBC: 3.46 10*6/uL — ABNORMAL LOW (ref 4.20–5.82)
WBC: 4.1 10*3/uL (ref 4.0–10.3)

## 2008-09-30 LAB — LACTATE DEHYDROGENASE: LDH: 188 U/L (ref 94–250)

## 2008-10-02 HISTORY — PX: INCISE AND DRAIN ABCESS: PRO64

## 2008-10-06 ENCOUNTER — Ambulatory Visit: Payer: Self-pay | Admitting: Internal Medicine

## 2008-10-06 LAB — CBC WITH DIFFERENTIAL/PLATELET
BASO%: 0.7 % (ref 0.0–2.0)
Basophils Absolute: 0 10*3/uL (ref 0.0–0.1)
EOS%: 0.3 % (ref 0.0–7.0)
HCT: 33.9 % — ABNORMAL LOW (ref 38.4–49.9)
LYMPH%: 11.5 % — ABNORMAL LOW (ref 14.0–49.0)
MCH: 31.7 pg (ref 27.2–33.4)
MCHC: 34.8 g/dL (ref 32.0–36.0)
MCV: 91.1 fL (ref 79.3–98.0)
NEUT%: 72.7 % (ref 39.0–75.0)
Platelets: 95 10*3/uL — ABNORMAL LOW (ref 140–400)

## 2008-10-13 LAB — CBC WITH DIFFERENTIAL/PLATELET
BASO%: 0.6 % (ref 0.0–2.0)
EOS%: 0.7 % (ref 0.0–7.0)
MCH: 32.5 pg (ref 27.2–33.4)
MCHC: 35.2 g/dL (ref 32.0–36.0)
MCV: 92.3 fL (ref 79.3–98.0)
MONO%: 11.3 % (ref 0.0–14.0)
NEUT%: 75.7 % — ABNORMAL HIGH (ref 39.0–75.0)
RDW: 13.4 % (ref 11.0–14.6)
lymph#: 0.6 10*3/uL — ABNORMAL LOW (ref 0.9–3.3)

## 2008-10-20 LAB — COMPREHENSIVE METABOLIC PANEL
ALT: 14 U/L (ref 0–53)
AST: 22 U/L (ref 0–37)
Alkaline Phosphatase: 59 U/L (ref 39–117)
BUN: 8 mg/dL (ref 6–23)
Calcium: 8.7 mg/dL (ref 8.4–10.5)
Chloride: 106 mEq/L (ref 96–112)
Creatinine, Ser: 0.89 mg/dL (ref 0.40–1.50)
Potassium: 3.4 mEq/L — ABNORMAL LOW (ref 3.5–5.3)

## 2008-10-20 LAB — CBC WITH DIFFERENTIAL/PLATELET
BASO%: 0.5 % (ref 0.0–2.0)
EOS%: 1.4 % (ref 0.0–7.0)
MCH: 31.4 pg (ref 27.2–33.4)
MCV: 91 fL (ref 79.3–98.0)
MONO%: 12.3 % (ref 0.0–14.0)
NEUT#: 4.8 10*3/uL (ref 1.5–6.5)
RBC: 3.88 10*6/uL — ABNORMAL LOW (ref 4.20–5.82)
RDW: 13.2 % (ref 11.0–14.6)

## 2008-10-23 LAB — CBC WITH DIFFERENTIAL/PLATELET
Eosinophils Absolute: 0 10*3/uL (ref 0.0–0.5)
MONO#: 0.1 10*3/uL (ref 0.1–0.9)
MONO%: 1 % (ref 0.0–14.0)
NEUT#: 8.4 10*3/uL — ABNORMAL HIGH (ref 1.5–6.5)
RBC: 3.65 10*6/uL — ABNORMAL LOW (ref 4.20–5.82)
RDW: 13 % (ref 11.0–14.6)
WBC: 9.1 10*3/uL (ref 4.0–10.3)

## 2008-10-27 ENCOUNTER — Ambulatory Visit: Payer: Self-pay | Admitting: Internal Medicine

## 2008-10-27 ENCOUNTER — Inpatient Hospital Stay (HOSPITAL_COMMUNITY): Admission: EM | Admit: 2008-10-27 | Discharge: 2008-11-03 | Payer: Self-pay | Admitting: Emergency Medicine

## 2008-11-10 ENCOUNTER — Ambulatory Visit (HOSPITAL_COMMUNITY): Admission: RE | Admit: 2008-11-10 | Discharge: 2008-11-10 | Payer: Self-pay | Admitting: Internal Medicine

## 2008-11-12 ENCOUNTER — Ambulatory Visit: Payer: Self-pay | Admitting: Internal Medicine

## 2008-11-16 LAB — COMPREHENSIVE METABOLIC PANEL
ALT: 15 U/L (ref 0–53)
AST: 21 U/L (ref 0–37)
Alkaline Phosphatase: 52 U/L (ref 39–117)
Chloride: 106 mEq/L (ref 96–112)
Creatinine, Ser: 0.97 mg/dL (ref 0.40–1.50)
Total Bilirubin: 0.3 mg/dL (ref 0.3–1.2)

## 2008-11-16 LAB — CBC WITH DIFFERENTIAL/PLATELET
Eosinophils Absolute: 0 10*3/uL (ref 0.0–0.5)
MONO#: 0.6 10*3/uL (ref 0.1–0.9)
NEUT#: 2.9 10*3/uL (ref 1.5–6.5)
RBC: 3.37 10*6/uL — ABNORMAL LOW (ref 4.20–5.82)
RDW: 15.6 % — ABNORMAL HIGH (ref 11.0–14.6)
WBC: 4.2 10*3/uL (ref 4.0–10.3)

## 2009-02-09 ENCOUNTER — Ambulatory Visit: Payer: Self-pay | Admitting: Internal Medicine

## 2009-02-11 ENCOUNTER — Ambulatory Visit (HOSPITAL_COMMUNITY): Admission: RE | Admit: 2009-02-11 | Discharge: 2009-02-11 | Payer: Self-pay | Admitting: Internal Medicine

## 2009-02-11 LAB — CBC WITH DIFFERENTIAL/PLATELET
BASO%: 0.6 % (ref 0.0–2.0)
Basophils Absolute: 0 10e3/uL (ref 0.0–0.1)
EOS%: 4.2 % (ref 0.0–7.0)
Eosinophils Absolute: 0.1 10e3/uL (ref 0.0–0.5)
HCT: 39.9 % (ref 38.4–49.9)
HGB: 14.1 g/dL (ref 13.0–17.1)
LYMPH%: 31 % (ref 14.0–49.0)
MCH: 30.6 pg (ref 27.2–33.4)
MCHC: 35.3 g/dL (ref 32.0–36.0)
MCV: 86.6 fL (ref 79.3–98.0)
MONO#: 0.4 10e3/uL (ref 0.1–0.9)
MONO%: 12.9 % (ref 0.0–14.0)
NEUT#: 1.4 10e3/uL — ABNORMAL LOW (ref 1.5–6.5)
NEUT%: 51.3 % (ref 39.0–75.0)
Platelets: 103 10e3/uL — ABNORMAL LOW (ref 140–400)
RBC: 4.61 10e6/uL (ref 4.20–5.82)
RDW: 12.9 % (ref 11.0–14.6)
WBC: 2.8 10e3/uL — ABNORMAL LOW (ref 4.0–10.3)
lymph#: 0.9 10e3/uL (ref 0.9–3.3)

## 2009-02-11 LAB — COMPREHENSIVE METABOLIC PANEL
ALT: 17 U/L (ref 0–53)
AST: 27 U/L (ref 0–37)
CO2: 25 mEq/L (ref 19–32)
Chloride: 104 mEq/L (ref 96–112)
Sodium: 137 mEq/L (ref 135–145)
Total Bilirubin: 0.7 mg/dL (ref 0.3–1.2)
Total Protein: 6.8 g/dL (ref 6.0–8.3)

## 2009-02-26 LAB — CBC WITH DIFFERENTIAL/PLATELET
Basophils Absolute: 0 10*3/uL (ref 0.0–0.1)
EOS%: 4.2 % (ref 0.0–7.0)
Eosinophils Absolute: 0.1 10*3/uL (ref 0.0–0.5)
HGB: 13.7 g/dL (ref 13.0–17.1)
LYMPH%: 43.6 % (ref 14.0–49.0)
MCH: 29.9 pg (ref 27.2–33.4)
MCV: 83.4 fL (ref 79.3–98.0)
MONO%: 16.3 % — ABNORMAL HIGH (ref 0.0–14.0)
NEUT#: 1 10*3/uL — ABNORMAL LOW (ref 1.5–6.5)
Platelets: 96 10*3/uL — ABNORMAL LOW (ref 140–400)
RBC: 4.58 10*6/uL (ref 4.20–5.82)

## 2009-03-12 ENCOUNTER — Ambulatory Visit: Payer: Self-pay | Admitting: Internal Medicine

## 2009-03-12 LAB — CBC WITH DIFFERENTIAL/PLATELET
Basophils Absolute: 0 10*3/uL (ref 0.0–0.1)
Eosinophils Absolute: 0.1 10*3/uL (ref 0.0–0.5)
LYMPH%: 34.3 % (ref 14.0–49.0)
MCV: 84.1 fL (ref 79.3–98.0)
MONO%: 14.3 % — ABNORMAL HIGH (ref 0.0–14.0)
NEUT#: 1.2 10*3/uL — ABNORMAL LOW (ref 1.5–6.5)
NEUT%: 46.8 % (ref 39.0–75.0)
Platelets: 112 10*3/uL — ABNORMAL LOW (ref 140–400)
RBC: 4.58 10*6/uL (ref 4.20–5.82)

## 2009-03-18 LAB — CBC WITH DIFFERENTIAL/PLATELET
BASO%: 0.3 % (ref 0.0–2.0)
EOS%: 2.1 % (ref 0.0–7.0)
LYMPH%: 26.8 % (ref 14.0–49.0)
MCH: 29.9 pg (ref 27.2–33.4)
MCHC: 35.5 g/dL (ref 32.0–36.0)
MCV: 84.2 fL (ref 79.3–98.0)
MONO%: 15.2 % — ABNORMAL HIGH (ref 0.0–14.0)
Platelets: 115 10*3/uL — ABNORMAL LOW (ref 140–400)
RBC: 4.55 10*6/uL (ref 4.20–5.82)
WBC: 3.4 10*3/uL — ABNORMAL LOW (ref 4.0–10.3)

## 2009-04-16 ENCOUNTER — Ambulatory Visit: Payer: Self-pay | Admitting: Internal Medicine

## 2009-04-20 LAB — CBC WITH DIFFERENTIAL/PLATELET
BASO%: 0.4 % (ref 0.0–2.0)
Basophils Absolute: 0 10*3/uL (ref 0.0–0.1)
Eosinophils Absolute: 0.1 10*3/uL (ref 0.0–0.5)
HCT: 38 % — ABNORMAL LOW (ref 38.4–49.9)
HGB: 13.4 g/dL (ref 13.0–17.1)
MONO#: 0.3 10*3/uL (ref 0.1–0.9)
NEUT#: 1.1 10*3/uL — ABNORMAL LOW (ref 1.5–6.5)
NEUT%: 48.6 % (ref 39.0–75.0)
WBC: 2.3 10*3/uL — ABNORMAL LOW (ref 4.0–10.3)
lymph#: 0.8 10*3/uL — ABNORMAL LOW (ref 0.9–3.3)

## 2009-04-20 LAB — COMPREHENSIVE METABOLIC PANEL
CO2: 24 mEq/L (ref 19–32)
Creatinine, Ser: 0.94 mg/dL (ref 0.40–1.50)
Glucose, Bld: 119 mg/dL — ABNORMAL HIGH (ref 70–99)
Total Bilirubin: 0.3 mg/dL (ref 0.3–1.2)

## 2009-04-20 LAB — LACTATE DEHYDROGENASE: LDH: 247 U/L (ref 94–250)

## 2009-04-23 LAB — CBC WITH DIFFERENTIAL/PLATELET
Basophils Absolute: 0 10*3/uL (ref 0.0–0.1)
Eosinophils Absolute: 0.2 10*3/uL (ref 0.0–0.5)
HGB: 13.7 g/dL (ref 13.0–17.1)
MCV: 85.3 fL (ref 79.3–98.0)
MONO%: 13.2 % (ref 0.0–14.0)
NEUT#: 1.3 10*3/uL — ABNORMAL LOW (ref 1.5–6.5)
Platelets: 98 10*3/uL — ABNORMAL LOW (ref 140–400)
RDW: 12.9 % (ref 11.0–14.6)

## 2009-06-18 ENCOUNTER — Ambulatory Visit: Payer: Self-pay | Admitting: Internal Medicine

## 2009-06-22 LAB — CBC WITH DIFFERENTIAL/PLATELET
BASO%: 0.3 % (ref 0.0–2.0)
Basophils Absolute: 0 10*3/uL (ref 0.0–0.1)
Eosinophils Absolute: 0.1 10*3/uL (ref 0.0–0.5)
HGB: 14.4 g/dL (ref 13.0–17.1)
LYMPH%: 23.6 % (ref 14.0–49.0)
MCH: 31 pg (ref 27.2–33.4)
MCHC: 36.1 g/dL — ABNORMAL HIGH (ref 32.0–36.0)
NEUT#: 2.1 10*3/uL (ref 1.5–6.5)
RBC: 4.63 10*6/uL (ref 4.20–5.82)
WBC: 3.4 10*3/uL — ABNORMAL LOW (ref 4.0–10.3)

## 2009-06-22 LAB — COMPREHENSIVE METABOLIC PANEL
ALT: 14 U/L (ref 0–53)
Albumin: 4.4 g/dL (ref 3.5–5.2)
Alkaline Phosphatase: 85 U/L (ref 39–117)
CO2: 25 mEq/L (ref 19–32)
Potassium: 3.9 mEq/L (ref 3.5–5.3)
Sodium: 140 mEq/L (ref 135–145)
Total Bilirubin: 0.4 mg/dL (ref 0.3–1.2)
Total Protein: 6.9 g/dL (ref 6.0–8.3)

## 2009-06-22 LAB — LACTATE DEHYDROGENASE: LDH: 296 U/L — ABNORMAL HIGH (ref 94–250)

## 2009-07-08 ENCOUNTER — Emergency Department (HOSPITAL_COMMUNITY): Admission: EM | Admit: 2009-07-08 | Discharge: 2009-07-09 | Payer: Self-pay | Admitting: Emergency Medicine

## 2009-08-17 ENCOUNTER — Ambulatory Visit: Payer: Self-pay | Admitting: Internal Medicine

## 2009-08-19 ENCOUNTER — Ambulatory Visit (HOSPITAL_COMMUNITY): Admission: RE | Admit: 2009-08-19 | Discharge: 2009-08-19 | Payer: Self-pay | Admitting: Internal Medicine

## 2009-08-19 LAB — CBC WITH DIFFERENTIAL/PLATELET
BASO%: 0.1 % (ref 0.0–2.0)
MCH: 31.4 pg (ref 27.2–33.4)
MCHC: 35.7 g/dL (ref 32.0–36.0)
MONO#: 0.2 10*3/uL (ref 0.1–0.9)
MONO%: 4.1 % (ref 0.0–14.0)
NEUT#: 2.9 10*3/uL (ref 1.5–6.5)
Platelets: 108 10*3/uL — ABNORMAL LOW (ref 140–400)
RBC: 4.61 10*6/uL (ref 4.20–5.82)
RDW: 12.6 % (ref 11.0–14.6)

## 2009-08-19 LAB — COMPREHENSIVE METABOLIC PANEL
Calcium: 9.1 mg/dL (ref 8.4–10.5)
Glucose, Bld: 94 mg/dL (ref 70–99)
Total Bilirubin: 0.9 mg/dL (ref 0.3–1.2)
Total Protein: 6.5 g/dL (ref 6.0–8.3)

## 2009-10-21 ENCOUNTER — Ambulatory Visit: Payer: Self-pay | Admitting: Internal Medicine

## 2009-10-25 LAB — COMPREHENSIVE METABOLIC PANEL
AST: 24 U/L (ref 0–37)
Alkaline Phosphatase: 73 U/L (ref 39–117)
Calcium: 9.5 mg/dL (ref 8.4–10.5)
Chloride: 105 mEq/L (ref 96–112)
Creatinine, Ser: 0.9 mg/dL (ref 0.40–1.50)
Sodium: 139 mEq/L (ref 135–145)
Total Bilirubin: 0.5 mg/dL (ref 0.3–1.2)
Total Protein: 7 g/dL (ref 6.0–8.3)

## 2009-10-25 LAB — CBC WITH DIFFERENTIAL/PLATELET
Basophils Absolute: 0.1 10*3/uL (ref 0.0–0.1)
EOS%: 2.1 % (ref 0.0–7.0)
LYMPH%: 12.8 % — ABNORMAL LOW (ref 14.0–49.0)
NEUT#: 3.4 10*3/uL (ref 1.5–6.5)
Platelets: 120 10*3/uL — ABNORMAL LOW (ref 140–400)
RDW: 12.7 % (ref 11.0–14.6)
WBC: 4.7 10*3/uL (ref 4.0–10.3)
nRBC: 0 % (ref 0–0)

## 2009-12-17 ENCOUNTER — Ambulatory Visit: Payer: Self-pay | Admitting: Internal Medicine

## 2009-12-20 LAB — CBC WITH DIFFERENTIAL/PLATELET
EOS%: 3.7 % (ref 0.0–7.0)
LYMPH%: 27.4 % (ref 14.0–49.0)
MCH: 30 pg (ref 27.2–33.4)
MCHC: 34.9 g/dL (ref 32.0–36.0)
MCV: 85.8 fL (ref 79.3–98.0)
MONO%: 20.7 % — ABNORMAL HIGH (ref 0.0–14.0)
Platelets: 115 10*3/uL — ABNORMAL LOW (ref 140–400)
RBC: 4.94 10*6/uL (ref 4.20–5.82)
RDW: 12.3 % (ref 11.0–14.6)
nRBC: 0 % (ref 0–0)

## 2009-12-20 LAB — COMPREHENSIVE METABOLIC PANEL
Alkaline Phosphatase: 81 U/L (ref 39–117)
CO2: 22 mEq/L (ref 19–32)
Creatinine, Ser: 1.11 mg/dL (ref 0.40–1.50)
Glucose, Bld: 98 mg/dL (ref 70–99)
Total Bilirubin: 0.4 mg/dL (ref 0.3–1.2)

## 2009-12-20 LAB — LACTATE DEHYDROGENASE: LDH: 299 U/L — ABNORMAL HIGH (ref 94–250)

## 2010-01-21 ENCOUNTER — Other Ambulatory Visit: Payer: Self-pay | Admitting: Internal Medicine

## 2010-01-21 DIAGNOSIS — C8590 Non-Hodgkin lymphoma, unspecified, unspecified site: Secondary | ICD-10-CM

## 2010-01-23 ENCOUNTER — Encounter: Payer: Self-pay | Admitting: Internal Medicine

## 2010-03-07 ENCOUNTER — Ambulatory Visit (HOSPITAL_COMMUNITY)
Admission: RE | Admit: 2010-03-07 | Discharge: 2010-03-07 | Disposition: A | Discharge status: Home / Self Care | Payer: BC Managed Care – PPO | Source: Ambulatory Visit | Attending: Internal Medicine | Admitting: Internal Medicine

## 2010-03-07 ENCOUNTER — Encounter (HOSPITAL_COMMUNITY): Payer: Self-pay

## 2010-03-07 DIAGNOSIS — C8590 Non-Hodgkin lymphoma, unspecified, unspecified site: Secondary | ICD-10-CM

## 2010-03-07 DIAGNOSIS — C8589 Other specified types of non-Hodgkin lymphoma, extranodal and solid organ sites: Secondary | ICD-10-CM | POA: Insufficient documentation

## 2010-03-07 DIAGNOSIS — R1031 Right lower quadrant pain: Secondary | ICD-10-CM | POA: Insufficient documentation

## 2010-03-07 DIAGNOSIS — K7689 Other specified diseases of liver: Secondary | ICD-10-CM | POA: Insufficient documentation

## 2010-03-07 DIAGNOSIS — J984 Other disorders of lung: Secondary | ICD-10-CM | POA: Insufficient documentation

## 2010-03-07 HISTORY — DX: Malignant (primary) neoplasm, unspecified: C80.1

## 2010-03-07 MED ORDER — IOHEXOL 300 MG/ML  SOLN
100.0000 mL | Freq: Once | INTRAMUSCULAR | Status: AC | PRN
  Administered 2010-03-07: 100 mL via INTRAVENOUS

## 2010-03-16 ENCOUNTER — Other Ambulatory Visit: Payer: Self-pay | Admitting: Internal Medicine

## 2010-03-16 ENCOUNTER — Encounter (HOSPITAL_BASED_OUTPATIENT_CLINIC_OR_DEPARTMENT_OTHER): Payer: BC Managed Care – PPO | Admitting: Internal Medicine

## 2010-03-16 DIAGNOSIS — C8299 Follicular lymphoma, unspecified, extranodal and solid organ sites: Secondary | ICD-10-CM

## 2010-03-16 DIAGNOSIS — Z5112 Encounter for antineoplastic immunotherapy: Secondary | ICD-10-CM

## 2010-03-16 LAB — CBC WITH DIFFERENTIAL/PLATELET
BASO%: 0.7 % (ref 0.0–2.0)
EOS%: 5 % (ref 0.0–7.0)
LYMPH%: 33.3 % (ref 14.0–49.0)
MCH: 29.8 pg (ref 27.2–33.4)
MCHC: 34.7 g/dL (ref 32.0–36.0)
MONO#: 0.4 10*3/uL (ref 0.1–0.9)
NEUT%: 47.2 % (ref 39.0–75.0)
RBC: 4.4 10*6/uL (ref 4.20–5.82)
WBC: 2.8 10*3/uL — ABNORMAL LOW (ref 4.0–10.3)
lymph#: 0.9 10*3/uL (ref 0.9–3.3)
nRBC: 0 % (ref 0–0)

## 2010-03-16 LAB — COMPREHENSIVE METABOLIC PANEL
AST: 19 U/L (ref 0–37)
Albumin: 4.3 g/dL (ref 3.5–5.2)
Alkaline Phosphatase: 63 U/L (ref 39–117)
BUN: 14 mg/dL (ref 6–23)
Creatinine, Ser: 1.03 mg/dL (ref 0.40–1.50)
Potassium: 3.8 mEq/L (ref 3.5–5.3)
Total Bilirubin: 0.3 mg/dL (ref 0.3–1.2)

## 2010-03-20 LAB — URINALYSIS, ROUTINE W REFLEX MICROSCOPIC
Glucose, UA: NEGATIVE mg/dL
Hgb urine dipstick: NEGATIVE
Specific Gravity, Urine: 1.027 (ref 1.005–1.030)
pH: 7.5 (ref 5.0–8.0)

## 2010-03-20 LAB — CBC
Hemoglobin: 14.2 g/dL (ref 12.0–15.0)
MCH: 31.1 pg (ref 26.0–34.0)
MCV: 87.6 fL (ref 78.0–100.0)
Platelets: 85 10*3/uL — ABNORMAL LOW (ref 150–400)
RBC: 4.56 MIL/uL (ref 3.87–5.11)
WBC: 4 10*3/uL (ref 4.0–10.5)

## 2010-03-20 LAB — BASIC METABOLIC PANEL
CO2: 24 mEq/L (ref 19–32)
Chloride: 108 mEq/L (ref 96–112)
GFR calc Af Amer: >60 mL/min (ref >60)
Potassium: 3.8 mEq/L (ref 3.5–5.1)
Sodium: 139 mEq/L (ref 135–145)

## 2010-03-20 LAB — DIFFERENTIAL
Basophils Absolute: 0 10*3/uL (ref 0.0–0.1)
Basophils Relative: 0 % (ref 0–1)
Eosinophils Absolute: 0 10*3/uL (ref 0.0–0.7)
Eosinophils Relative: 1 % (ref 0–5)
Monocytes Absolute: 0.4 10*3/uL (ref 0.1–1.0)
Neutro Abs: 3 10*3/uL (ref 1.7–7.7)

## 2010-04-06 LAB — DIFFERENTIAL
Eosinophils Absolute: 0.1 10*3/uL (ref 0.0–0.7)
Lymphs Abs: 0.6 10*3/uL — ABNORMAL LOW (ref 0.7–4.0)
Monocytes Absolute: 0.6 10*3/uL (ref 0.1–1.0)
Neutrophils Relative %: 79 % — ABNORMAL HIGH (ref 43–77)

## 2010-04-06 LAB — CBC
Hemoglobin: 8.1 g/dL — ABNORMAL LOW (ref 13.0–17.0)
Hemoglobin: 9 g/dL — ABNORMAL LOW (ref 13.0–17.0)
Platelets: 56 10*3/uL — ABNORMAL LOW (ref 150–400)
RBC: 2.82 MIL/uL — ABNORMAL LOW (ref 4.22–5.81)
RDW: 12.7 % (ref 11.5–15.5)
RDW: 12.8 % (ref 11.5–15.5)

## 2010-04-06 LAB — BASIC METABOLIC PANEL
Calcium: 8.7 mg/dL (ref 8.4–10.5)
Creatinine, Ser: 0.81 mg/dL (ref 0.4–1.5)
GFR calc Af Amer: >60 mL/min (ref >60)
GFR calc non Af Amer: >60 mL/min (ref >60)
Sodium: 142 mEq/L (ref 135–145)

## 2010-04-07 LAB — BASIC METABOLIC PANEL
CO2: 28 mEq/L (ref 19–32)
Chloride: 105 mEq/L (ref 96–112)
Glucose, Bld: 108 mg/dL — ABNORMAL HIGH (ref 70–99)
Potassium: 3.1 mEq/L — ABNORMAL LOW (ref 3.5–5.1)
Sodium: 137 mEq/L (ref 135–145)

## 2010-04-07 LAB — COMPREHENSIVE METABOLIC PANEL
ALT: 18 U/L (ref 0–53)
ALT: 33 U/L (ref 0–53)
Alkaline Phosphatase: 44 U/L (ref 39–117)
Alkaline Phosphatase: 62 U/L (ref 39–117)
BUN: 10 mg/dL (ref 6–23)
BUN: 11 mg/dL (ref 6–23)
CO2: 25 mEq/L (ref 19–32)
CO2: 26 mEq/L (ref 19–32)
Chloride: 100 mEq/L (ref 96–112)
GFR calc non Af Amer: >60 mL/min (ref >60)
GFR calc non Af Amer: >60 mL/min (ref >60)
Glucose, Bld: 102 mg/dL — ABNORMAL HIGH (ref 70–99)
Glucose, Bld: 148 mg/dL — ABNORMAL HIGH (ref 70–99)
Potassium: 3.5 mEq/L (ref 3.5–5.1)
Potassium: 3.8 mEq/L (ref 3.5–5.1)
Sodium: 133 mEq/L — ABNORMAL LOW (ref 135–145)
Sodium: 134 mEq/L — ABNORMAL LOW (ref 135–145)
Total Bilirubin: 1.1 mg/dL (ref 0.3–1.2)
Total Bilirubin: 1.1 mg/dL (ref 0.3–1.2)
Total Protein: 5.9 g/dL — ABNORMAL LOW (ref 6.0–8.3)

## 2010-04-07 LAB — CBC
HCT: 23.1 % — ABNORMAL LOW (ref 39.0–52.0)
HCT: 25 % — ABNORMAL LOW (ref 39.0–52.0)
HCT: 27.3 % — ABNORMAL LOW (ref 39.0–52.0)
HCT: 30 % — ABNORMAL LOW (ref 39.0–52.0)
Hemoglobin: 10.4 g/dL — ABNORMAL LOW (ref 13.0–17.0)
Hemoglobin: 8.1 g/dL — ABNORMAL LOW (ref 13.0–17.0)
Hemoglobin: 8.7 g/dL — ABNORMAL LOW (ref 13.0–17.0)
Hemoglobin: 9.6 g/dL — ABNORMAL LOW (ref 13.0–17.0)
MCHC: 34.8 g/dL (ref 30.0–36.0)
Platelets: 18 10*3/uL — CL (ref 150–400)
RBC: 2.75 MIL/uL — ABNORMAL LOW (ref 4.22–5.81)
RBC: 3.21 MIL/uL — ABNORMAL LOW (ref 4.22–5.81)
RDW: 11.8 % (ref 11.5–15.5)
RDW: 12.4 % (ref 11.5–15.5)
RDW: 12.7 % (ref 11.5–15.5)
WBC: 0.6 10*3/uL — CL (ref 4.0–10.5)
WBC: 2.4 10*3/uL — ABNORMAL LOW (ref 4.0–10.5)

## 2010-04-07 LAB — URINALYSIS, ROUTINE W REFLEX MICROSCOPIC
Glucose, UA: NEGATIVE mg/dL
Hgb urine dipstick: NEGATIVE
Specific Gravity, Urine: 1.013 (ref 1.005–1.030)
Urobilinogen, UA: 2 mg/dL — ABNORMAL HIGH (ref 0.0–1.0)

## 2010-04-07 LAB — DIFFERENTIAL
Basophils Absolute: 0 10*3/uL (ref 0.0–0.1)
Basophils Absolute: 0 10*3/uL (ref 0.0–0.1)
Basophils Absolute: 0 10*3/uL (ref 0.0–0.1)
Basophils Relative: 0 % (ref 0–1)
Basophils Relative: 1 % (ref 0–1)
Eosinophils Absolute: 0 10*3/uL (ref 0.0–0.7)
Eosinophils Absolute: 0 10*3/uL (ref 0.0–0.7)
Eosinophils Absolute: 0 10*3/uL (ref 0.0–0.7)
Lymphocytes Relative: 0 % — ABNORMAL LOW (ref 12–46)
Lymphocytes Relative: 0 % — ABNORMAL LOW (ref 12–46)
Lymphocytes Relative: 20 % (ref 12–46)
Lymphs Abs: 0 10*3/uL — ABNORMAL LOW (ref 0.7–4.0)
Lymphs Abs: 0.6 10*3/uL — ABNORMAL LOW (ref 0.7–4.0)
Monocytes Absolute: 0 10*3/uL — ABNORMAL LOW (ref 0.1–1.0)
Monocytes Absolute: 0 10*3/uL — ABNORMAL LOW (ref 0.1–1.0)
Monocytes Relative: 0 % — ABNORMAL LOW (ref 3–12)
Monocytes Relative: 14 % — ABNORMAL HIGH (ref 3–12)
Monocytes Relative: 15 % — ABNORMAL HIGH (ref 3–12)
Neutro Abs: 1.4 10*3/uL — ABNORMAL LOW (ref 1.7–7.7)
Neutro Abs: 2.3 10*3/uL (ref 1.7–7.7)
Neutrophils Relative %: 0 % — ABNORMAL LOW (ref 43–77)
Neutrophils Relative %: 0 % — ABNORMAL LOW (ref 43–77)
nRBC: 0 /100 WBC

## 2010-04-07 LAB — CULTURE, BLOOD (ROUTINE X 2)
Culture: NO GROWTH
Culture: NO GROWTH
Culture: NO GROWTH

## 2010-04-07 LAB — GLUCOSE, CAPILLARY: Glucose-Capillary: 101 mg/dL — ABNORMAL HIGH (ref 70–99)

## 2010-04-07 LAB — CULTURE, ROUTINE-ABSCESS: Gram Stain: NONE SEEN

## 2010-04-07 LAB — ANAEROBIC CULTURE: Gram Stain: NONE SEEN

## 2010-04-07 LAB — FUNGUS CULTURE W SMEAR: Fungal Smear: NONE SEEN

## 2010-04-07 LAB — URINE CULTURE

## 2010-04-07 LAB — WOUND CULTURE

## 2010-04-07 LAB — TISSUE CULTURE: Gram Stain: NONE SEEN

## 2010-04-07 LAB — GRAM STAIN

## 2010-04-07 LAB — TSH: TSH: 0.35 u[IU]/mL (ref 0.350–4.500)

## 2010-04-07 LAB — VANCOMYCIN, TROUGH: Vancomycin Tr: 10.6 ug/mL (ref 10.0–20.0)

## 2010-04-14 LAB — DIFFERENTIAL
Basophils Relative: 0 % (ref 0–1)
Eosinophils Absolute: 0 10*3/uL (ref 0.0–0.7)
Lymphs Abs: 0.7 10*3/uL (ref 0.7–4.0)
Monocytes Absolute: 0.2 10*3/uL (ref 0.1–1.0)
Neutro Abs: 1.7 10*3/uL (ref 1.7–7.7)
Neutrophils Relative %: 67 % (ref 43–77)

## 2010-04-14 LAB — CBC
HCT: 35.9 % — ABNORMAL LOW (ref 39.0–52.0)
MCHC: 35.3 g/dL (ref 30.0–36.0)
MCV: 82.9 fL (ref 78.0–100.0)
RBC: 4.33 MIL/uL (ref 4.22–5.81)

## 2010-04-14 LAB — BASIC METABOLIC PANEL
CO2: 25 mEq/L (ref 19–32)
Chloride: 107 mEq/L (ref 96–112)
Creatinine, Ser: 1.04 mg/dL (ref 0.4–1.5)
GFR calc Af Amer: >60 mL/min (ref >60)

## 2010-04-14 LAB — ABO/RH: ABO/RH(D): O POS

## 2010-04-14 LAB — PREPARE PLATELETS

## 2010-05-16 ENCOUNTER — Other Ambulatory Visit: Payer: Self-pay | Admitting: Internal Medicine

## 2010-05-16 ENCOUNTER — Encounter (HOSPITAL_BASED_OUTPATIENT_CLINIC_OR_DEPARTMENT_OTHER): Payer: BC Managed Care – PPO | Admitting: Internal Medicine

## 2010-05-16 DIAGNOSIS — Z5112 Encounter for antineoplastic immunotherapy: Secondary | ICD-10-CM

## 2010-05-16 DIAGNOSIS — C8299 Follicular lymphoma, unspecified, extranodal and solid organ sites: Secondary | ICD-10-CM

## 2010-05-16 LAB — COMPREHENSIVE METABOLIC PANEL
AST: 19 U/L (ref 0–37)
Albumin: 4.5 g/dL (ref 3.5–5.2)
BUN: 9 mg/dL (ref 6–23)
Calcium: 9.7 mg/dL (ref 8.4–10.5)
Chloride: 105 mEq/L (ref 96–112)
Glucose, Bld: 155 mg/dL — ABNORMAL HIGH (ref 70–99)
Potassium: 3.8 mEq/L (ref 3.5–5.3)
Sodium: 139 mEq/L (ref 135–145)
Total Protein: 7 g/dL (ref 6.0–8.3)

## 2010-05-16 LAB — CBC WITH DIFFERENTIAL/PLATELET
Basophils Absolute: 0 10*3/uL (ref 0.0–0.1)
Eosinophils Absolute: 0.1 10*3/uL (ref 0.0–0.5)
HCT: 44.4 % (ref 38.4–49.9)
HGB: 15.5 g/dL (ref 13.0–17.1)
MONO#: 0.3 10*3/uL (ref 0.1–0.9)
NEUT#: 1.4 10*3/uL — ABNORMAL LOW (ref 1.5–6.5)
NEUT%: 56.8 % (ref 39.0–75.0)
RDW: 12.3 % (ref 11.0–14.6)
WBC: 2.5 10*3/uL — ABNORMAL LOW (ref 4.0–10.3)
lymph#: 0.7 10*3/uL — ABNORMAL LOW (ref 0.9–3.3)

## 2010-05-17 NOTE — Op Note (Signed)
NAME:  Isaac Barnes, MATTERS NO.:  [id number]   MEDICAL RECORD NO.:  [id number]          PATIENT TYPE:  AMB   LOCATION:  SDS                          FACILITY:  [facility]   PHYSICIAN:  Almond Lint, MD       DATE OF BIRTH:  [birth year]   DATE OF PROCEDURE:  03/19/2008  DATE OF DISCHARGE:  03/19/2008                               OPERATIVE REPORT   PREOPERATIVE DIAGNOSIS:  Lymphadenopathy.   POSTOPERATIVE DIAGNOSIS:  Lymphadenopathy.   INDICATION FOR PROCEDURE:  [name] is a 60 year old male with diffuse  lymphadenopathy who underwent a lymph node biopsy last year, which was  nondiagnostic for lymphoma.  Despite that, he has continued to worsen  and has had increase in his lymphadenopathy and now has splenomegaly.  He presents for lymph node biopsy in the opposite axilla.   PROCEDURE PERFORMED:  Left axillary lymph node biopsy.   SURGEON:  Almond Lint, MD   ASSISTANT:  None.   ANESTHESIA:  General and local.   FINDINGS:  Matted enlarged lymph nodes.   SPECIMEN:  Two matted nodes to Pathology for lymphoma workup.   ESTIMATED BLOOD LOSS:  Minimal.   COMPLICATIONS:  None known.   PROCEDURE:  [name] was identified in the holding area and taken to  the operating room where he was placed supine on the operating room  table.  General anesthesia was induced.  His left axilla was clipped,  prepped, and draped in a sterile fashion.  Time-out was performed  according to the surgical safety check list.  When all was correct, we  continued.  The 3-cm incision was made along the inferior hairline of  the axilla.  The skin was first anesthetized with a mixture of 1%  lidocaine plain and 0.25% Marcaine with epinephrine.  The subcutaneous  tissues were divided with Bovie electrocautery.  The clavipectoral  fascia was entered with the Bovie electrocautery.  Lymph node was  identified and elevated by the perilymph node tissues with the tonsil  clamp.  Another tonsil clamp  was used to dissect around the node.  However, there were vessels, these were clipped to minimize the  possibility of seroma.  The first lymph node was matted to an additional  lymph node and this was taken together with the other lymph node.  At  the base of these 2 lymph nodes, there was a large vessel entering and  this was clipped.  Once the lymph nodes were out of the wound, the wound  was irrigated and hemostasis was achieved with Bovie electrocautery.  The wound was re-irrigated and the irrigant returned clear.  There was  no sign of bleeding.  A Ray-Tec was placed into the wound to also  evaluate for bleeding and none was seen.  The clavipectoral fascia was  closed with two interrupted 3-0 Vicryl pops.  The skin was closed using  3-0 Vicryl pops in interrupted fashion in the deep  dermis and the skin was then closed using a 4-0 Monocryl in a  subcuticular fashion.  The skin was then cleaned, dried, and dressed  with Dermabond.  The patient was then awakened from anesthesia and taken  to PACU in stable condition.      Almond Lint, MD  Electronically Signed     FB/MEDQ  D:  03/19/2008  T:  03/20/2008  Job:  [id number]

## 2010-05-17 NOTE — Consult Note (Signed)
NEW PATIENT CONSULTATION   Isaac Barnes, Isaac Barnes  DOB:  09-Jan-1950                                       03/11/2007  LKGMW#:[id number]   Patient is a 60 year old male referred by Dr. Azucena Cecil for evaluation of  spider and reticular veins of both lower extremities.  He states that  these are not causing any symptoms such as pain, stinging, burning, or  aching.  He has no history of deep venous thrombosis, thrombophlebitis,  bleeding, ulceration, or distal edema.  He has had previous  sclerotherapy performed about 10-12 years ago in this area, but he is  not certain who did that.  Saline injections were performed, which  resulted in a satisfactory result for several years.  Recently, he has  developed increasing prominence of spider veins and reticular veins,  particularly in the medial aspect of both thighs and into the medial  calf areas.   PAST MEDICAL HISTORY:  Negative for diabetes or hypertension, coronary  artery disease, hyperlipidemia, COPD, or stroke.   PREVIOUS SURGERY:  Includes appendectomy and tonsillectomy.   FAMILY HISTORY:  Positive for coronary artery disease in his 26 year old  mother.  Negative for diabetes and stroke.   SOCIAL HISTORY:  He is single, has two children.  Works two jobs ([employer] and at [employer]).  He has smoked a half pack of cigarettes per day  for approximately 20-25 years but stopped at the first of this year  (2009).  Does not use alcohol on a regular basis.   ALLERGIES:  PENICILLIN, CEPHALOSPORINS, E-MYCIN, SULFA.   PHYSICAL EXAMINATION:  Blood pressure 134/89, heart rate 79,  respirations 14.  Generally, he is a healthy-appearing middle-aged male  in no apparent distress.  He is alert and oriented x3.  Neck is supple  with 3+ carotid pulses palpable.  No bruits are audible.  Neurologic  exam is normal.  No palpable adenopathy in the neck.  Chest:  Clear to  auscultation.  Cardiovascular:  Regular rhythm with no murmurs.   Abdomen  is soft and nontender with no palpable mass.  He has 3+ femoral,  popliteal, dorsalis pedis and posterior tibial pulses bilaterally.  Both  feet are well perfused.  There is no distal edema.  He has some diffuse  spider veins and reticular veins in the distal thighs medially  bilaterally and in the proximal medial calf areas.  There are no  significant bulbous varicosities.  There is no hyperpigmentation,  ulceration, or other skin changes distally.   I think his main interest is cosmetic in nature, and sclerotherapy  combined with some skin laser treatments could benefit this.  We will  arrange for these sessions in the near future to try to meet his needs  after further discussing this with him.   Quita Skye Hart [place], M.D.  Electronically Signed   JDL/MEDQ  D:  03/11/2007  T:  03/11/2007  Job:  [id number]   cc:   Tally Joe, M.D.

## 2010-05-17 NOTE — Op Note (Signed)
NAME:  Isaac Barnes, Isaac Barnes NO.:  [id number]   MEDICAL RECORD NO.:  [id number]          PATIENT TYPE:  AMB   LOCATION:  DSC                          FACILITY:  [facility]   PHYSICIAN:  Lennie Muckle, MD      DATE OF BIRTH:  1950-10-20   DATE OF PROCEDURE:  04/03/2007  DATE OF DISCHARGE:                               OPERATIVE REPORT   PREOPERATIVE DIAGNOSIS:  Right axillary lymphadenopathy, rule out  lymphoma.   POSTOPERATIVE DIAGNOSIS:  Right axillary lymphadenopathy, rule out  lymphoma.   PROCEDURE:  Right axillary lymph node biopsy.   SURGEON:  Lennie Muckle, MD.   ASSISTANT:  None.   ANESTHESIA:  General endotracheal anesthesia.   INDICATION FOR PROCEDURE:  Isaac Barnes is a 60 year old male who  apparently has a question of lymphoma.  He was having left leg pain when  retroperitoneal and iliac as well as axillary lymphadenopathy was  appreciated; to completely rule out a low-grade lymphoma was requested  to perform an excisional biopsy of lymph nodes.  The right axillary  region had palpable lymph nodes on examination.  Informed consent was  obtained for biopsy of these lymph nodes.   DESCRIPTION OF PROCEDURE:  Isaac Barnes was identified in the preoperative  holding suite.  He was given IV cefoxitin preoperatively and then taken  to the operating room.  He was placed under general endotracheal  anesthesia in the supine position.  His right axillary region was  prepped and draped in the usual sterile fashion.  An incision was placed  just beneath the hairline in the right axilla.  Subcutaneous tissues  divided with blunt dissection and electrocautery.  There were several  lymph nodes which were palpable.  These were grasped with an Allis clamp  and removed using electrocautery.  These were passed off the operative  field.  The wound bed was irrigated.  Hemostasis was achieved with  electrocautery.  After final irrigation there was no evidence of  bleeding within  the wound bed.  I palpated without any significant  lymphadenopathy further appreciated, no appreciable enlarged lymph nodes  with palpation.  The subcutaneous tissues were closed with 3-0 Vicryl  suture for a deep closure and a dermal with 3-0 Vicryl suture.  Skin was  closed with 4-0 Monocryl.  Steri-Strip was placed for final dressing.  The patient was then  extubated and transported to post anesthesia care unit in stable  condition.  The lymph nodes were sent for lymphoma workup.  He will  follow up with me in approximately 2-3 weeks' time.  I will give him  Vicodin if he does need this for pain, otherwise he can use over-the-  counter Tylenol or ibuprofen.      Lennie Muckle, MD  Electronically Signed     ALA/MEDQ  D:  04/03/2007  T:  04/03/2007  Job:  [id number]   cc:   Lajuana Matte, MD  Harvie Junior, M.D.  Tally Joe, M.D.  [place] Daphine Deutscher, MD

## 2010-05-20 NOTE — Op Note (Signed)
[place]  Patient:    Isaac Barnes, Isaac Barnes                       MRN: [id number] Proc. Date: 04/15/99 Adm. Date:  [id number] Disc. Date: [id number] Attending:  Monica Becton CC:         Excell Seltzer. Annabell Howells, M.D.             Claudette Laws, M.D.                           Operative Report  BRIEFLY:  Mr. Delpilar is a 60 year old white male patient of Dr. Javier Glazier, who had stent insertion earlier in the week, followed by lithotripsy on Thursday. e is having intractable bladder spasms from the stent and this has not responded o medical therapy.  He wants to get the stent out.  He does not have a string.  I  obtained a KUB, which revealed a couple of fragments in the kidney measuring 2-3 mm in the mid to lower pole, but nothing along the course of the ureter.  The stent was in good position.  He was given 60 mg of Toradol with only partial relief. It was then felt that cystoscopy and stent removal was indicated.  PROCEDURE:  His penis was prepped with Betadine solution.  His urethra was instilled with 2% lidocaine jelly.  A 16-French flexible scope was inserted without difficulty.  The stent was visualized and was grasped with a grasping forcep and removed without difficulty.  There were no complications during the procedure. The patient will follow-up with Dr. Etta Grandchild next week.  He was given an additional prescription for Demerol 50 mg 1-2 p.o. q.4-6h. p.r.n. pain #25 in case of ureteral spasm. DD:  04/15/99 TD:  04/15/99 Job: [id number]

## 2010-05-20 NOTE — Op Note (Signed)
[place]. [facility]  Patient:    Isaac Barnes, Isaac Barnes                       MRN: [id number] Proc. Date: 04/12/99 Adm. Date:  [id number] Disc. Date: [id number] Attending:  Monica Becton                           Operative Report  PREOPERATIVE DIAGNOSIS: 1. Midleft ureteral calculus. 2. Severe renal colic secondary to #1.  POSTOPERATIVE DIAGNOSIS: 1. Midleft ureteral calculus. 2. Severe renal colic secondary to #1.  OPERATION PERFORMED: 1. Cystoscopy, left pyeloureterogram. 2. Insertion of a 6 French double-J ureteral stent.  SURGEON:  Claudette Laws, M.D.  ANESTHESIA:  INDICATIONS FOR PROCEDURE:  The patient is a 60 year old otherwise healthy man who presented in the emergency room early this morning with left-sided renal colic.  A noncontrast CT showed about 6 to 7 mm stone in the midleft ureter. He then came to my office where follow-up KUB showed a stone between L2 and L3 on the left.  He was having severe colic.  Because we were unable to control the pain, his decision was made to put up a double-J stent.  We explained to him the nature of the procedure.  He understands and agrees to the proposed therapy.  DESCRIPTION OF PROCEDURE:  The patient was prepped and draped in the dorsal lithotomy position under intubated general anesthesia.  Cystoscopy was performed with a 21 French rigid cystourethroscope.  There was no urethral stricture.  He had a small prostate, normal-appearing bladder, no tumors, no calculi.  Normal ureteral orifices.  Initially, I passed up a 0.038 Glide Wire through an open ended 6 [place] ureteral catheter.  Retrograde studies were done and copies were obtained showing a left hydronephrosis.  We could see the stone about the midureter.  We then backed out the open ended catheter and put up a 6 French 26 cm double-J ureteral stent without difficulty.  It was positioned in the upper pole calix.  The distal end was curled up  in the bladder.  The bladder was emptied.  5 cc of Xylocaine jelly was instilled intraurethrally and he was taken back to the recovery room in satisfactory condition.  The plan now is to follow up with [facility]. DD:  04/12/99 TD:  04/13/99 Job: [id number]

## 2010-07-13 ENCOUNTER — Other Ambulatory Visit: Payer: Self-pay | Admitting: Internal Medicine

## 2010-07-13 ENCOUNTER — Encounter (HOSPITAL_BASED_OUTPATIENT_CLINIC_OR_DEPARTMENT_OTHER): Payer: BC Managed Care – PPO | Admitting: Internal Medicine

## 2010-07-13 DIAGNOSIS — Z5112 Encounter for antineoplastic immunotherapy: Secondary | ICD-10-CM

## 2010-07-13 DIAGNOSIS — C8590 Non-Hodgkin lymphoma, unspecified, unspecified site: Secondary | ICD-10-CM

## 2010-07-13 DIAGNOSIS — Z5111 Encounter for antineoplastic chemotherapy: Secondary | ICD-10-CM

## 2010-07-13 DIAGNOSIS — C8299 Follicular lymphoma, unspecified, extranodal and solid organ sites: Secondary | ICD-10-CM

## 2010-07-13 LAB — CBC WITH DIFFERENTIAL/PLATELET
BASO%: 1.7 % (ref 0.0–2.0)
Eosinophils Absolute: 0.1 10*3/uL (ref 0.0–0.5)
LYMPH%: 26.5 % (ref 14.0–49.0)
MCHC: 35.1 g/dL (ref 32.0–36.0)
MCV: 85.1 fL (ref 79.3–98.0)
MONO%: 16 % — ABNORMAL HIGH (ref 0.0–14.0)
NEUT#: 1.2 10*3/uL — ABNORMAL LOW (ref 1.5–6.5)
RBC: 5.05 10*6/uL (ref 4.20–5.82)
RDW: 12.4 % (ref 11.0–14.6)
WBC: 2.4 10*3/uL — ABNORMAL LOW (ref 4.0–10.3)
nRBC: 0 % (ref 0–0)

## 2010-07-13 LAB — COMPREHENSIVE METABOLIC PANEL
Albumin: 4.2 g/dL (ref 3.5–5.2)
BUN: 10 mg/dL (ref 6–23)
Calcium: 9 mg/dL (ref 8.4–10.5)
Chloride: 106 mEq/L (ref 96–112)
Glucose, Bld: 121 mg/dL — ABNORMAL HIGH (ref 70–99)
Sodium: 137 mEq/L (ref 135–145)

## 2010-09-08 ENCOUNTER — Other Ambulatory Visit: Payer: Self-pay | Admitting: Internal Medicine

## 2010-09-08 ENCOUNTER — Ambulatory Visit (HOSPITAL_COMMUNITY)
Admission: RE | Admit: 2010-09-08 | Discharge: 2010-09-08 | Disposition: A | Discharge status: Home / Self Care | Payer: BC Managed Care – PPO | Source: Ambulatory Visit | Attending: Internal Medicine | Admitting: Internal Medicine

## 2010-09-08 ENCOUNTER — Encounter (HOSPITAL_COMMUNITY): Payer: Self-pay

## 2010-09-08 ENCOUNTER — Encounter (HOSPITAL_BASED_OUTPATIENT_CLINIC_OR_DEPARTMENT_OTHER): Payer: BC Managed Care – PPO | Admitting: Internal Medicine

## 2010-09-08 DIAGNOSIS — C8590 Non-Hodgkin lymphoma, unspecified, unspecified site: Secondary | ICD-10-CM

## 2010-09-08 DIAGNOSIS — C8589 Other specified types of non-Hodgkin lymphoma, extranodal and solid organ sites: Secondary | ICD-10-CM | POA: Insufficient documentation

## 2010-09-08 DIAGNOSIS — C8299 Follicular lymphoma, unspecified, extranodal and solid organ sites: Secondary | ICD-10-CM

## 2010-09-08 DIAGNOSIS — Z9089 Acquired absence of other organs: Secondary | ICD-10-CM | POA: Insufficient documentation

## 2010-09-08 DIAGNOSIS — R109 Unspecified abdominal pain: Secondary | ICD-10-CM | POA: Insufficient documentation

## 2010-09-08 LAB — LACTATE DEHYDROGENASE: LDH: 255 U/L — ABNORMAL HIGH (ref 94–250)

## 2010-09-08 LAB — CMP (CANCER CENTER ONLY)
ALT(SGPT): 18 U/L (ref 10–47)
AST: 25 U/L (ref 11–38)
BUN, Bld: 14 mg/dL (ref 7–22)
CO2: 26 mEq/L (ref 18–33)
Calcium: 9 mg/dL (ref 8.0–10.3)
Creat: 0.9 mg/dl (ref 0.6–1.2)
Total Bilirubin: 0.5 mg/dl (ref 0.20–1.60)

## 2010-09-08 LAB — CBC WITH DIFFERENTIAL/PLATELET
BASO%: 0.4 % (ref 0.0–2.0)
EOS%: 2.2 % (ref 0.0–7.0)
LYMPH%: 24.3 % (ref 14.0–49.0)
MCH: 31.1 pg (ref 27.2–33.4)
MCHC: 35.6 g/dL (ref 32.0–36.0)
MONO#: 0.1 10*3/uL (ref 0.1–0.9)
NEUT%: 69.3 % (ref 39.0–75.0)
Platelets: 107 10*3/uL — ABNORMAL LOW (ref 140–400)
RBC: 4.76 10*6/uL (ref 4.20–5.82)
WBC: 2.7 10*3/uL — ABNORMAL LOW (ref 4.0–10.3)
lymph#: 0.6 10*3/uL — ABNORMAL LOW (ref 0.9–3.3)

## 2010-09-08 MED ORDER — IOHEXOL 300 MG/ML  SOLN
100.0000 mL | Freq: Once | INTRAMUSCULAR | Status: AC | PRN
  Administered 2010-09-08: 100 mL via INTRAVENOUS

## 2010-09-13 ENCOUNTER — Encounter (HOSPITAL_BASED_OUTPATIENT_CLINIC_OR_DEPARTMENT_OTHER): Payer: BC Managed Care – PPO | Admitting: Internal Medicine

## 2010-09-13 DIAGNOSIS — L989 Disorder of the skin and subcutaneous tissue, unspecified: Secondary | ICD-10-CM

## 2010-09-13 DIAGNOSIS — R109 Unspecified abdominal pain: Secondary | ICD-10-CM

## 2010-09-13 DIAGNOSIS — C8299 Follicular lymphoma, unspecified, extranodal and solid organ sites: Secondary | ICD-10-CM

## 2010-09-13 DIAGNOSIS — Z5111 Encounter for antineoplastic chemotherapy: Secondary | ICD-10-CM

## 2010-09-23 ENCOUNTER — Encounter (HOSPITAL_BASED_OUTPATIENT_CLINIC_OR_DEPARTMENT_OTHER): Payer: BC Managed Care – PPO | Admitting: Internal Medicine

## 2010-09-23 DIAGNOSIS — Z5112 Encounter for antineoplastic immunotherapy: Secondary | ICD-10-CM

## 2010-09-23 DIAGNOSIS — C8299 Follicular lymphoma, unspecified, extranodal and solid organ sites: Secondary | ICD-10-CM

## 2010-10-06 LAB — POCT I-STAT, CHEM 8
Creatinine, Ser: 1.2 mg/dL (ref 0.4–1.5)
Hemoglobin: 12.9 g/dL — ABNORMAL LOW (ref 13.0–17.0)
Potassium: 3.8 mEq/L (ref 3.5–5.1)
Sodium: 140 mEq/L (ref 135–145)
TCO2: 28 mmol/L (ref 0–100)

## 2010-10-06 LAB — POCT CARDIAC MARKERS
CKMB, poc: <1 ng/mL — ABNORMAL LOW (ref 1.0–8.0)
Myoglobin, poc: 70.5 ng/mL (ref 12–200)

## 2010-10-06 LAB — D-DIMER, QUANTITATIVE: D-Dimer, Quant: 2.12 ug/mL-FEU — ABNORMAL HIGH (ref 0.00–0.48)

## 2010-10-17 ENCOUNTER — Encounter: Payer: Self-pay | Admitting: Internal Medicine

## 2010-11-01 ENCOUNTER — Encounter: Payer: BC Managed Care – PPO | Admitting: Internal Medicine

## 2010-11-08 NOTE — Progress Notes (Signed)
This encounter was created in error - please disregard.

## 2010-11-09 ENCOUNTER — Other Ambulatory Visit: Payer: Self-pay | Admitting: Internal Medicine

## 2010-11-09 DIAGNOSIS — C8290 Follicular lymphoma, unspecified, unspecified site: Secondary | ICD-10-CM

## 2010-11-10 ENCOUNTER — Other Ambulatory Visit: Payer: Self-pay | Admitting: Internal Medicine

## 2010-11-10 ENCOUNTER — Ambulatory Visit: Payer: BC Managed Care – PPO

## 2010-11-10 ENCOUNTER — Other Ambulatory Visit (HOSPITAL_BASED_OUTPATIENT_CLINIC_OR_DEPARTMENT_OTHER): Payer: BC Managed Care – PPO | Admitting: Lab

## 2010-11-10 ENCOUNTER — Encounter: Payer: Self-pay | Admitting: *Deleted

## 2010-11-10 DIAGNOSIS — C8299 Follicular lymphoma, unspecified, extranodal and solid organ sites: Secondary | ICD-10-CM

## 2010-11-10 DIAGNOSIS — C8590 Non-Hodgkin lymphoma, unspecified, unspecified site: Secondary | ICD-10-CM

## 2010-11-10 LAB — CBC WITH DIFFERENTIAL/PLATELET
BASO%: 1.7 % (ref 0.0–2.0)
EOS%: 4.6 % (ref 0.0–7.0)
LYMPH%: 27.2 % (ref 14.0–49.0)
MCH: 30.3 pg (ref 27.2–33.4)
MCHC: 35.2 g/dL (ref 32.0–36.0)
MCV: 86.2 fL (ref 79.3–98.0)
MONO%: 15.1 % — ABNORMAL HIGH (ref 0.0–14.0)
NEUT#: 1.2 10*3/uL — ABNORMAL LOW (ref 1.5–6.5)
Platelets: 102 10*3/uL — ABNORMAL LOW (ref 140–400)
RBC: 5.01 10*6/uL (ref 4.20–5.82)
RDW: 12.3 % (ref 11.0–14.6)
nRBC: 0 % (ref 0–0)

## 2010-11-10 NOTE — Progress Notes (Signed)
Pt chemo appt cancelled for today due to chemo not being due until 11/19.  Per Dr Donnald Garre, need to r/s f/u with AJ, lab, and chemo 5 hours to 11/19.  Onc tx schedule filled out and sent to schedulers.

## 2010-11-11 ENCOUNTER — Telehealth: Payer: Self-pay | Admitting: Internal Medicine

## 2010-11-11 NOTE — Telephone Encounter (Signed)
gv pt appt for 11/19 @ 10:15 am. Per order moved f/uw/AJ from 11/13 to 11/19 w/lb+ chemo.

## 2010-11-15 ENCOUNTER — Ambulatory Visit: Payer: BC Managed Care – PPO | Admitting: Physician Assistant

## 2010-11-19 ENCOUNTER — Other Ambulatory Visit: Payer: Self-pay | Admitting: Physician Assistant

## 2010-11-21 ENCOUNTER — Ambulatory Visit: Payer: BC Managed Care – PPO

## 2010-11-21 ENCOUNTER — Other Ambulatory Visit: Payer: BC Managed Care – PPO | Admitting: Lab

## 2010-11-21 ENCOUNTER — Ambulatory Visit: Payer: BC Managed Care – PPO | Admitting: Physician Assistant

## 2011-01-06 ENCOUNTER — Other Ambulatory Visit: Payer: Self-pay | Admitting: *Deleted

## 2011-01-06 ENCOUNTER — Encounter: Payer: Self-pay | Admitting: *Deleted

## 2011-01-06 NOTE — Progress Notes (Signed)
Pt cancelled his November lab/AJ/chemo appts and did not r/s.  Called and left msg with mother (pt was at work) to inform him to call the scheduling dept when he is ready to r/s his appts. She verbalized understanding,  SLJ

## 2011-01-09 ENCOUNTER — Telehealth: Payer: Self-pay | Admitting: Internal Medicine

## 2011-01-09 NOTE — Telephone Encounter (Signed)
Working 2013 appts pt had pending appts for 1/19 tx but missed 11/19 lb/fu/tx appts. Per cancellation reason appts cancelled by switchboard per pt's request and pt stated he would call back to r/s. Message to desk nurse Friday re above.

## 2012-02-26 ENCOUNTER — Telehealth: Payer: Self-pay | Admitting: Internal Medicine

## 2012-02-26 NOTE — Telephone Encounter (Signed)
Faxed pt medical records to Dr. Sharyne Richters @ [facility]

## 2012-03-07 DIAGNOSIS — K2100 Gastro-esophageal reflux disease with esophagitis, without bleeding: Secondary | ICD-10-CM | POA: Insufficient documentation

## 2012-11-20 ENCOUNTER — Telehealth: Payer: Self-pay | Admitting: Medical Oncology

## 2012-11-20 NOTE — Telephone Encounter (Signed)
Dr Matthias Hughs requests hematology clearance for GI procedures. I called Velna Hatchet back and told her that Dr Arbutus Ped has not seen pt in several years and to contact Dr Sharyne Richters at [facility].

## 2012-11-25 ENCOUNTER — Other Ambulatory Visit: Payer: Self-pay | Admitting: Gastroenterology

## 2013-03-02 ENCOUNTER — Emergency Department (HOSPITAL_COMMUNITY)
Admission: EM | Admit: 2013-03-02 | Discharge: 2013-03-02 | Disposition: A | Discharge status: Home / Self Care | Payer: Medicare Other | Attending: Emergency Medicine | Admitting: Emergency Medicine

## 2013-03-02 ENCOUNTER — Encounter (HOSPITAL_COMMUNITY): Payer: Self-pay | Admitting: Emergency Medicine

## 2013-03-02 DIAGNOSIS — Z79899 Other long term (current) drug therapy: Secondary | ICD-10-CM | POA: Insufficient documentation

## 2013-03-02 DIAGNOSIS — Z87442 Personal history of urinary calculi: Secondary | ICD-10-CM | POA: Insufficient documentation

## 2013-03-02 DIAGNOSIS — F17200 Nicotine dependence, unspecified, uncomplicated: Secondary | ICD-10-CM | POA: Insufficient documentation

## 2013-03-02 DIAGNOSIS — R300 Dysuria: Secondary | ICD-10-CM | POA: Insufficient documentation

## 2013-03-02 DIAGNOSIS — E860 Dehydration: Secondary | ICD-10-CM | POA: Insufficient documentation

## 2013-03-02 DIAGNOSIS — Z880 Allergy status to penicillin: Secondary | ICD-10-CM | POA: Insufficient documentation

## 2013-03-02 DIAGNOSIS — K219 Gastro-esophageal reflux disease without esophagitis: Secondary | ICD-10-CM | POA: Insufficient documentation

## 2013-03-02 DIAGNOSIS — Z87898 Personal history of other specified conditions: Secondary | ICD-10-CM | POA: Insufficient documentation

## 2013-03-02 HISTORY — DX: Calculus of kidney: N20.0

## 2013-03-02 LAB — URINALYSIS, ROUTINE W REFLEX MICROSCOPIC
BILIRUBIN URINE: NEGATIVE
Glucose, UA: NEGATIVE mg/dL
Hgb urine dipstick: NEGATIVE
KETONES UR: NEGATIVE mg/dL
Leukocytes, UA: NEGATIVE
NITRITE: NEGATIVE
PROTEIN: NEGATIVE mg/dL
SPECIFIC GRAVITY, URINE: 1.022 (ref 1.005–1.030)
UROBILINOGEN UA: 1 mg/dL (ref 0.0–1.0)
pH: 6 (ref 5.0–8.0)

## 2013-03-02 LAB — CBC WITH DIFFERENTIAL/PLATELET
BASOS PCT: 0 % (ref 0–1)
Basophils Absolute: 0 10*3/uL (ref 0.0–0.1)
EOS PCT: 2 % (ref 0–5)
Eosinophils Absolute: 0.1 10*3/uL (ref 0.0–0.7)
HCT: 40.5 % (ref 39.0–52.0)
HEMOGLOBIN: 13.7 g/dL (ref 13.0–17.0)
LYMPHS ABS: 1.2 10*3/uL (ref 0.7–4.0)
Lymphocytes Relative: 24 % (ref 12–46)
MCH: 30.1 pg (ref 26.0–34.0)
MCHC: 33.8 g/dL (ref 30.0–36.0)
MCV: 89 fL (ref 78.0–100.0)
MONO ABS: 0.5 10*3/uL (ref 0.1–1.0)
Monocytes Relative: 10 % (ref 3–12)
NEUTROS PCT: 64 % (ref 43–77)
Neutro Abs: 3.3 10*3/uL (ref 1.7–7.7)
Platelets: 108 10*3/uL — ABNORMAL LOW (ref 150–400)
RBC: 4.55 MIL/uL (ref 4.22–5.81)
RDW: 12.6 % (ref 11.5–15.5)
WBC: 5.1 10*3/uL (ref 4.0–10.5)

## 2013-03-02 LAB — COMPREHENSIVE METABOLIC PANEL
ALT: 9 U/L (ref 0–53)
AST: 17 U/L (ref 0–37)
Albumin: 3.5 g/dL (ref 3.5–5.2)
Alkaline Phosphatase: 69 U/L (ref 39–117)
BILIRUBIN TOTAL: 0.6 mg/dL (ref 0.3–1.2)
BUN: 9 mg/dL (ref 6–23)
CALCIUM: 9 mg/dL (ref 8.4–10.5)
CHLORIDE: 102 meq/L (ref 96–112)
CO2: 27 meq/L (ref 19–32)
Creatinine, Ser: 0.81 mg/dL (ref 0.50–1.35)
GLUCOSE: 91 mg/dL (ref 70–99)
Potassium: 4.4 mEq/L (ref 3.7–5.3)
SODIUM: 139 meq/L (ref 137–147)
Total Protein: 6.1 g/dL (ref 6.0–8.3)

## 2013-03-02 MED ORDER — SODIUM CHLORIDE 0.9 % IV BOLUS (SEPSIS)
1000.0000 mL | Freq: Once | INTRAVENOUS | Status: AC
  Administered 2013-03-02: 1000 mL via INTRAVENOUS

## 2013-03-02 MED ORDER — CIPROFLOXACIN HCL 500 MG PO TABS
500.0000 mg | ORAL_TABLET | Freq: Once | ORAL | Status: AC
  Administered 2013-03-02: 500 mg via ORAL
  Filled 2013-03-02: qty 1

## 2013-03-02 MED ORDER — CIPROFLOXACIN HCL 500 MG PO TABS: 500.0000 mg | ORAL_TABLET | Freq: Two times a day (BID) | ORAL | Status: DC

## 2013-03-02 NOTE — ED Notes (Signed)
Pt reports urinary retention onset of 7 days ago, states he started having burning sensation yesterday, fever and weakness. Pt has no apparent signs of distress.

## 2013-03-02 NOTE — ED Provider Notes (Signed)
CSN: [id number]     Arrival date & time 03/02/13  1936 History   First MD Initiated Contact with Patient 03/02/13 2027     Chief Complaint  Patient presents with  . Urinary Retention  . Weakness     (Consider location/radiation/quality/duration/timing/severity/associated sxs/prior Treatment) Patient is a 63 y.o. male presenting with dysuria. The history is provided by the patient (the pt complains of dysuria).  Dysuria This is a new problem. The current episode started 12 to 24 hours ago. The problem occurs constantly. The problem has not changed since onset.Pertinent negatives include no chest pain, no abdominal pain and no headaches. Nothing aggravates the symptoms. Nothing relieves the symptoms.    Past Medical History  Diagnosis Date  . 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 GERD (gastroesophageal reflux disease)   . Kidney calculi    Past Surgical History  Procedure Laterality Date  . Appendectomy    . Tonsillectomy     No family history on file. History  Substance Use Topics  . Smoking status: Current Every Day Smoker -- 2.00 packs/day    Types: Cigarettes  . Smokeless tobacco: Never Used  . Alcohol Use: No    Review of Systems  Constitutional: Negative for appetite change and fatigue.  HENT: Negative for congestion, ear discharge and sinus pressure.   Eyes: Negative for discharge.  Respiratory: Negative for cough.   Cardiovascular: Negative for chest pain.  Gastrointestinal: Negative for abdominal pain and diarrhea.  Genitourinary: Positive for dysuria. Negative for frequency and hematuria.  Musculoskeletal: Negative for back pain.  Skin: Negative for rash.  Neurological: Negative for seizures and headaches.  Psychiatric/Behavioral: Negative for hallucinations.      Allergies  Cephalosporins; Erythromycin; Penicillins; and Sulfa antibiotics  Home Medications   Current Outpatient Rx  Name  Route  Sig  Dispense  Refill  . alprazolam (XANAX) 2 MG  tablet   Oral   Take 2 mg by mouth 3 (three) times daily as needed for sleep or anxiety.         Marland Kitchen oxyCODONE-acetaminophen (PERCOCET) 7.5-325 MG per tablet   Oral   Take 1 tablet by mouth every 4 (four) hours as needed for pain.         . pantoprazole (PROTONIX) 40 MG tablet   Oral   Take 40 mg by mouth daily as needed (heart burn).         . ciprofloxacin (CIPRO) 500 MG tablet   Oral   Take 1 tablet (500 mg total) by mouth 2 (two) times daily. One po bid x 7 days   14 tablet   0   . omeprazole (PRILOSEC) 20 MG capsule   Oral   Take 20 mg by mouth daily.            BP 108/66  Pulse 74  Temp(Src) 99.4 F (37.4 C)  Resp 16  SpO2 94% Physical Exam  Constitutional: He is oriented to person, place, and time. He appears well-developed.  HENT:  Head: Normocephalic.  Eyes: Conjunctivae and EOM are normal. No scleral icterus.  Neck: Neck supple. No thyromegaly present.  Cardiovascular: Normal rate and regular rhythm.  Exam reveals no gallop and no friction rub.   No murmur heard. Pulmonary/Chest: No stridor. He has no wheezes. He has no rales. He exhibits no tenderness.  Abdominal: He exhibits no distension. There is no tenderness. There is no rebound.  Musculoskeletal: Normal range of motion. He exhibits no edema.  Lymphadenopathy:  He has no cervical adenopathy.  Neurological: He is oriented to person, place, and time. He exhibits normal muscle tone. Coordination normal.  Skin: No rash noted. No erythema.  Psychiatric: He has a normal mood and affect. His behavior is normal.    ED Course  Procedures (including critical care time) Labs Review Labs Reviewed  CBC WITH DIFFERENTIAL - Abnormal; Notable for the following:    Platelets 108 (*)    All other components within normal limits  URINALYSIS, ROUTINE W REFLEX MICROSCOPIC - Abnormal; Notable for the following:    Color, Urine AMBER (*)    APPearance CLOUDY (*)    All other components within normal limits   URINE CULTURE  COMPREHENSIVE METABOLIC PANEL   Imaging Review No results found.   EKG Interpretation None      MDM   Final diagnoses:  Dysuria  Dehydration    Pt has dysuria and dehydration.  Will get urine culture and have pt follow.  tx with cipro    Maudry Diego, MD 03/02/13 2242

## 2013-03-02 NOTE — ED Notes (Signed)
Pt states he has not urinated since 0400 and it was only a small amount.  Bladder scanner is showing 96 ml's in his bladder.  Bladder does not feel distended.

## 2013-03-02 NOTE — Discharge Instructions (Signed)
Follow up with your md in one week 

## 2013-03-02 NOTE — ED Notes (Signed)
Bed: XH74 Expected date:  Expected time:  Means of arrival:  Comments: Hold for Tith

## 2013-03-04 LAB — URINE CULTURE
COLONY COUNT: NO GROWTH
Culture: NO GROWTH

## 2013-08-13 ENCOUNTER — Emergency Department (HOSPITAL_COMMUNITY): Payer: No Typology Code available for payment source

## 2013-08-13 ENCOUNTER — Observation Stay (HOSPITAL_COMMUNITY)
Admission: EM | Admit: 2013-08-13 | Discharge: 2013-08-15 | Disposition: A | Discharge status: Home / Self Care | Payer: No Typology Code available for payment source | Attending: Internal Medicine | Admitting: Internal Medicine

## 2013-08-13 ENCOUNTER — Encounter (HOSPITAL_COMMUNITY): Payer: Self-pay | Admitting: Emergency Medicine

## 2013-08-13 DIAGNOSIS — Y92410 Unspecified street and highway as the place of occurrence of the external cause: Secondary | ICD-10-CM | POA: Insufficient documentation

## 2013-08-13 DIAGNOSIS — F329 Major depressive disorder, single episode, unspecified: Secondary | ICD-10-CM | POA: Diagnosis not present

## 2013-08-13 DIAGNOSIS — K219 Gastro-esophageal reflux disease without esophagitis: Secondary | ICD-10-CM | POA: Diagnosis not present

## 2013-08-13 DIAGNOSIS — Z79899 Other long term (current) drug therapy: Secondary | ICD-10-CM | POA: Diagnosis not present

## 2013-08-13 DIAGNOSIS — S52509A Unspecified fracture of the lower end of unspecified radius, initial encounter for closed fracture: Principal | ICD-10-CM | POA: Insufficient documentation

## 2013-08-13 DIAGNOSIS — F411 Generalized anxiety disorder: Secondary | ICD-10-CM | POA: Diagnosis not present

## 2013-08-13 DIAGNOSIS — S52609A Unspecified fracture of lower end of unspecified ulna, initial encounter for closed fracture: Principal | ICD-10-CM

## 2013-08-13 DIAGNOSIS — F17200 Nicotine dependence, unspecified, uncomplicated: Secondary | ICD-10-CM | POA: Insufficient documentation

## 2013-08-13 DIAGNOSIS — R55 Syncope and collapse: Secondary | ICD-10-CM | POA: Diagnosis not present

## 2013-08-13 DIAGNOSIS — Z880 Allergy status to penicillin: Secondary | ICD-10-CM | POA: Diagnosis not present

## 2013-08-13 DIAGNOSIS — Z882 Allergy status to sulfonamides status: Secondary | ICD-10-CM | POA: Diagnosis not present

## 2013-08-13 DIAGNOSIS — S5290XA Unspecified fracture of unspecified forearm, initial encounter for closed fracture: Secondary | ICD-10-CM

## 2013-08-13 DIAGNOSIS — Z881 Allergy status to other antibiotic agents status: Secondary | ICD-10-CM | POA: Diagnosis not present

## 2013-08-13 DIAGNOSIS — D696 Thrombocytopenia, unspecified: Secondary | ICD-10-CM | POA: Insufficient documentation

## 2013-08-13 DIAGNOSIS — F3289 Other specified depressive episodes: Secondary | ICD-10-CM | POA: Insufficient documentation

## 2013-08-13 DIAGNOSIS — C8290 Follicular lymphoma, unspecified, unspecified site: Secondary | ICD-10-CM

## 2013-08-13 DIAGNOSIS — R0789 Other chest pain: Secondary | ICD-10-CM | POA: Diagnosis not present

## 2013-08-13 DIAGNOSIS — C8299 Follicular lymphoma, unspecified, extranodal and solid organ sites: Secondary | ICD-10-CM | POA: Diagnosis present

## 2013-08-13 DIAGNOSIS — Z23 Encounter for immunization: Secondary | ICD-10-CM | POA: Insufficient documentation

## 2013-08-13 DIAGNOSIS — Z9221 Personal history of antineoplastic chemotherapy: Secondary | ICD-10-CM | POA: Diagnosis not present

## 2013-08-13 DIAGNOSIS — S5292XA Unspecified fracture of left forearm, initial encounter for closed fracture: Secondary | ICD-10-CM

## 2013-08-13 HISTORY — DX: Major depressive disorder, single episode, unspecified: F32.9

## 2013-08-13 HISTORY — DX: Depression, unspecified: F32.A

## 2013-08-13 HISTORY — DX: Unspecified fracture of left forearm, initial encounter for closed fracture: S52.92XA

## 2013-08-13 LAB — CBC WITH DIFFERENTIAL/PLATELET
BASOS PCT: 0 % (ref 0–1)
Basophils Absolute: 0 10*3/uL (ref 0.0–0.1)
Eosinophils Absolute: 0.1 10*3/uL (ref 0.0–0.7)
Eosinophils Relative: 1 % (ref 0–5)
HCT: 42.8 % (ref 39.0–52.0)
HEMOGLOBIN: 14.8 g/dL (ref 13.0–17.0)
LYMPHS PCT: 16 % (ref 12–46)
Lymphs Abs: 1.4 10*3/uL (ref 0.7–4.0)
MCH: 30.6 pg (ref 26.0–34.0)
MCHC: 34.6 g/dL (ref 30.0–36.0)
MCV: 88.4 fL (ref 78.0–100.0)
MONOS PCT: 7 % (ref 3–12)
Monocytes Absolute: 0.6 10*3/uL (ref 0.1–1.0)
NEUTROS ABS: 6.8 10*3/uL (ref 1.7–7.7)
NEUTROS PCT: 76 % (ref 43–77)
Platelets: 128 10*3/uL — ABNORMAL LOW (ref 150–400)
RBC: 4.84 MIL/uL (ref 4.22–5.81)
RDW: 12.9 % (ref 11.5–15.5)
WBC: 8.8 10*3/uL (ref 4.0–10.5)

## 2013-08-13 LAB — URINALYSIS, ROUTINE W REFLEX MICROSCOPIC
Bilirubin Urine: NEGATIVE
Glucose, UA: NEGATIVE mg/dL
Hgb urine dipstick: NEGATIVE
KETONES UR: NEGATIVE mg/dL
LEUKOCYTES UA: NEGATIVE
NITRITE: NEGATIVE
PROTEIN: NEGATIVE mg/dL
Specific Gravity, Urine: 1.037 — ABNORMAL HIGH (ref 1.005–1.030)
UROBILINOGEN UA: 1 mg/dL (ref 0.0–1.0)
pH: 7 (ref 5.0–8.0)

## 2013-08-13 LAB — RAPID URINE DRUG SCREEN, HOSP PERFORMED
Amphetamines: NOT DETECTED
Barbiturates: NOT DETECTED
Benzodiazepines: POSITIVE — AB
COCAINE: NOT DETECTED
Opiates: NOT DETECTED
Tetrahydrocannabinol: NOT DETECTED

## 2013-08-13 LAB — BASIC METABOLIC PANEL
Anion gap: 13 (ref 5–15)
BUN: 7 mg/dL (ref 6–23)
CHLORIDE: 104 meq/L (ref 96–112)
CO2: 26 mEq/L (ref 19–32)
Calcium: 9.5 mg/dL (ref 8.4–10.5)
Creatinine, Ser: 0.76 mg/dL (ref 0.50–1.35)
GFR calc non Af Amer: >90 mL/min (ref >90)
Glucose, Bld: 105 mg/dL — ABNORMAL HIGH (ref 70–99)
POTASSIUM: 3.7 meq/L (ref 3.7–5.3)
Sodium: 143 mEq/L (ref 137–147)

## 2013-08-13 LAB — VITAMIN B12: Vitamin B-12: 241 pg/mL (ref 211–911)

## 2013-08-13 LAB — ETHANOL: Alcohol, Ethyl (B): <11 mg/dL (ref 0–11)

## 2013-08-13 LAB — SALICYLATE LEVEL: Salicylate Lvl: <2 mg/dL — ABNORMAL LOW (ref 2.8–20.0)

## 2013-08-13 LAB — ACETAMINOPHEN LEVEL: Acetaminophen (Tylenol), Serum: <15 ug/mL (ref 10–30)

## 2013-08-13 LAB — I-STAT TROPONIN, ED: TROPONIN I, POC: 0 ng/mL (ref 0.00–0.08)

## 2013-08-13 LAB — FOLATE: FOLATE: 8.8 ng/mL

## 2013-08-13 MED ORDER — ALPRAZOLAM 0.5 MG PO TABS: 2.0000 mg | ORAL_TABLET | Freq: Three times a day (TID) | ORAL | Status: DC | PRN

## 2013-08-13 MED ORDER — SODIUM CHLORIDE 0.9 % IJ SOLN: 3.0000 mL | INTRAMUSCULAR | Status: DC | PRN

## 2013-08-13 MED ORDER — ACETAMINOPHEN 325 MG PO TABS: 650.0000 mg | ORAL_TABLET | Freq: Four times a day (QID) | ORAL | Status: DC | PRN

## 2013-08-13 MED ORDER — SODIUM CHLORIDE 0.9 % IJ SOLN
3.0000 mL | Freq: Two times a day (BID) | INTRAMUSCULAR | Status: DC
  Administered 2013-08-13 – 2013-08-15 (×5): 3 mL via INTRAVENOUS

## 2013-08-13 MED ORDER — ACETAMINOPHEN 650 MG RE SUPP: 650.0000 mg | Freq: Four times a day (QID) | RECTAL | Status: DC | PRN

## 2013-08-13 MED ORDER — CIPROFLOXACIN HCL 500 MG PO TABS: 500.0000 mg | ORAL_TABLET | Freq: Two times a day (BID) | ORAL | Status: DC

## 2013-08-13 MED ORDER — NALOXONE HCL 0.4 MG/ML IJ SOLN
0.4000 mg | Freq: Once | INTRAMUSCULAR | Status: AC
  Administered 2013-08-13: 0.4 mg via INTRAVENOUS
  Filled 2013-08-13: qty 1

## 2013-08-13 MED ORDER — IOHEXOL 300 MG/ML  SOLN
100.0000 mL | Freq: Once | INTRAMUSCULAR | Status: AC | PRN
  Administered 2013-08-13: 100 mL via INTRAVENOUS

## 2013-08-13 MED ORDER — ONDANSETRON HCL 4 MG PO TABS: 4.0000 mg | ORAL_TABLET | Freq: Four times a day (QID) | ORAL | Status: DC | PRN

## 2013-08-13 MED ORDER — OXYCODONE HCL 5 MG PO TABS
2.5000 mg | ORAL_TABLET | ORAL | Status: DC | PRN
  Administered 2013-08-13: 2.5 mg via ORAL
  Filled 2013-08-13: qty 1

## 2013-08-13 MED ORDER — OXYCODONE-ACETAMINOPHEN 7.5-325 MG PO TABS: 1.0000 | ORAL_TABLET | ORAL | Status: DC | PRN

## 2013-08-13 MED ORDER — MORPHINE SULFATE 2 MG/ML IJ SOLN
2.0000 mg | INTRAMUSCULAR | Status: DC | PRN
  Administered 2013-08-13 – 2013-08-14 (×3): 2 mg via INTRAVENOUS
  Filled 2013-08-13 (×3): qty 1

## 2013-08-13 MED ORDER — ONDANSETRON HCL 4 MG/2ML IJ SOLN: 4.0000 mg | Freq: Four times a day (QID) | INTRAMUSCULAR | Status: DC | PRN

## 2013-08-13 MED ORDER — SODIUM CHLORIDE 0.9 % IJ SOLN
3.0000 mL | Freq: Two times a day (BID) | INTRAMUSCULAR | Status: DC
  Administered 2013-08-13: 3 mL via INTRAVENOUS

## 2013-08-13 MED ORDER — PNEUMOCOCCAL VAC POLYVALENT 25 MCG/0.5ML IJ INJ
0.5000 mL | INJECTION | INTRAMUSCULAR | Status: AC
  Administered 2013-08-14: 0.5 mL via INTRAMUSCULAR
  Filled 2013-08-13: qty 0.5

## 2013-08-13 MED ORDER — PANTOPRAZOLE SODIUM 40 MG PO TBEC
40.0000 mg | DELAYED_RELEASE_TABLET | Freq: Every day | ORAL | Status: DC
  Administered 2013-08-13 – 2013-08-15 (×3): 40 mg via ORAL
  Filled 2013-08-13 (×3): qty 1

## 2013-08-13 MED ORDER — OXYCODONE-ACETAMINOPHEN 5-325 MG PO TABS
1.0000 | ORAL_TABLET | ORAL | Status: DC | PRN
Start: 2013-08-13 — End: 2013-08-14
  Administered 2013-08-13: 1 via ORAL
  Filled 2013-08-13: qty 1

## 2013-08-13 MED ORDER — SODIUM CHLORIDE 0.9 % IV SOLN: 250.0000 mL | INTRAVENOUS | Status: DC | PRN

## 2013-08-13 NOTE — ED Notes (Signed)
Isaac Barnes daughter stepped downstairs can be reached at [phone] at [facility] office
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
## 2013-08-13 NOTE — ED Notes (Signed)
Rob PA-C at bedside. Ortho called for wrist splint.

## 2013-08-13 NOTE — ED Notes (Signed)
Ct called for delay. sts will contact radiologist. Family updated on delay.
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
## 2013-08-13 NOTE — ED Notes (Signed)
Patient given mouth swabs to moisten mouth. Pt. Attempting to get out of bed sts "this is the worst nursing home [facility]". Rn apologetic for wait, updated patient on delay and re-oriented patient. RN helped patient get pants on per request. Pt more calm and cooperative. Responses still delayed.

## 2013-08-13 NOTE — Progress Notes (Signed)
Orthopedic Tech Progress Note Patient Details:  Isaac Barnes Mar 02, 1950 [id number]  Ortho Devices Type of Ortho Device: Ace wrap;Arm sling;Sugartong splint Ortho Device/Splint Location: LUE Ortho Device/Splint Interventions: Ordered;Application   Braulio Bosch 08/13/2013, 12:41 PM

## 2013-08-13 NOTE — Progress Notes (Signed)
Pt refused MRI of brain. He reports he has repeatedly tried them in the past (during chemo treatments), but is unable even with medications due to claustrophobia.
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
## 2013-08-14 ENCOUNTER — Observation Stay (HOSPITAL_COMMUNITY): Payer: No Typology Code available for payment source

## 2013-08-14 DIAGNOSIS — S52609A Unspecified fracture of lower end of unspecified ulna, initial encounter for closed fracture: Secondary | ICD-10-CM | POA: Diagnosis not present

## 2013-08-14 DIAGNOSIS — S52509A Unspecified fracture of the lower end of unspecified radius, initial encounter for closed fracture: Secondary | ICD-10-CM | POA: Diagnosis not present

## 2013-08-14 LAB — BASIC METABOLIC PANEL
ANION GAP: 13 (ref 5–15)
BUN: 12 mg/dL (ref 6–23)
CHLORIDE: 103 meq/L (ref 96–112)
CO2: 24 meq/L (ref 19–32)
CREATININE: 0.74 mg/dL (ref 0.50–1.35)
Calcium: 9 mg/dL (ref 8.4–10.5)
GFR calc Af Amer: >90 mL/min (ref >90)
GFR calc non Af Amer: >90 mL/min (ref >90)
Glucose, Bld: 94 mg/dL (ref 70–99)
POTASSIUM: 4.1 meq/L (ref 3.7–5.3)
Sodium: 140 mEq/L (ref 137–147)

## 2013-08-14 LAB — CBC
HEMATOCRIT: 41.7 % (ref 39.0–52.0)
HEMOGLOBIN: 14.1 g/dL (ref 13.0–17.0)
MCH: 30.1 pg (ref 26.0–34.0)
MCHC: 33.8 g/dL (ref 30.0–36.0)
MCV: 89.1 fL (ref 78.0–100.0)
Platelets: 132 10*3/uL — ABNORMAL LOW (ref 150–400)
RBC: 4.68 MIL/uL (ref 4.22–5.81)
RDW: 13.1 % (ref 11.5–15.5)
WBC: 6.9 10*3/uL (ref 4.0–10.5)

## 2013-08-14 LAB — TSH: TSH: 0.581 u[IU]/mL (ref 0.350–4.500)

## 2013-08-14 LAB — SAMPLE TO BLOOD BANK

## 2013-08-14 LAB — VITAMIN D 25 HYDROXY (VIT D DEFICIENCY, FRACTURES): Vit D, 25-Hydroxy: 40 ng/mL (ref 30–89)

## 2013-08-14 MED ORDER — HYDROMORPHONE HCL PF 1 MG/ML IJ SOLN
1.0000 mg | INTRAMUSCULAR | Status: DC | PRN
  Administered 2013-08-14 (×2): 1 mg via INTRAVENOUS
  Filled 2013-08-14 (×3): qty 1

## 2013-08-14 MED ORDER — OXYCODONE HCL 5 MG PO TABS: 10.0000 mg | ORAL_TABLET | ORAL | Status: DC | PRN

## 2013-08-14 MED ORDER — OXYCODONE HCL 5 MG PO TABS
15.0000 mg | ORAL_TABLET | ORAL | Status: DC | PRN
  Administered 2013-08-14 – 2013-08-15 (×5): 15 mg via ORAL
  Filled 2013-08-14 (×5): qty 3

## 2013-08-14 MED ORDER — ENOXAPARIN SODIUM 40 MG/0.4ML ~~LOC~~ SOLN
40.0000 mg | SUBCUTANEOUS | Status: DC
  Administered 2013-08-14: 40 mg via SUBCUTANEOUS
  Filled 2013-08-14 (×2): qty 0.4

## 2013-08-14 MED ORDER — HYDROMORPHONE HCL PF 1 MG/ML IJ SOLN
0.5000 mg | INTRAMUSCULAR | Status: DC | PRN
  Administered 2013-08-14 – 2013-08-15 (×2): 0.5 mg via INTRAVENOUS
  Filled 2013-08-14 (×2): qty 1

## 2013-08-14 NOTE — Progress Notes (Signed)
OT Cancellation Note  Patient Details Name: Isaac Barnes MRN: [id number] DOB: 1950/11/12   Cancelled Treatment:    Reason Eval/Treat Not Completed: Patient at procedure or test/ unavailable (EEG)  Maren Wiesen A 08/14/2013, 9:23 AM
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
## 2013-08-14 NOTE — Progress Notes (Signed)
  Echocardiogram 2D Echocardiogram has been performed.  Isaac Barnes 08/14/2013, 11:20 AM

## 2013-08-14 NOTE — Care Management Note (Addendum)
    Page 1 of 1   08/15/2013     9:50:49 AM CARE MANAGEMENT NOTE 08/15/2013  Patient:  Isaac Barnes, Isaac Barnes   Account Number:  [id number]  Date Initiated:  08/14/2013  Documentation initiated by:  Tomi Bamberger  Subjective/Objective Assessment:   dx syncope  admit- lives with spouse.     Action/Plan:   pt /ot eval- out pt physical therapy   Anticipated DC Date:  08/15/2013   Anticipated DC Plan:  [place]  CM consult      Choice offered to / List presented to:             Status of service:  Completed, signed off Medicare Important Message given?   (If response is "NO", the following Medicare IM given date fields will be blank) Date Medicare IM given:   Medicare IM given by:   Date Additional Medicare IM given:   Additional Medicare IM given by:    Discharge Disposition:  HOME/SELF CARE  Per UR Regulation:  Reviewed for med. necessity/level of care/duration of stay  If discussed at [place] of Stay Meetings, dates discussed:    Comments:  08/14/13 [place], BSN [phone] per physical therapy rec outpt physical therapy, NCM sent referral through epic to [facility] on [address].  [phone].  Awaiting EEG and echo results if negative will dc home.
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
## 2013-08-14 NOTE — Progress Notes (Signed)
UR Completed Zaylon Bossier Graves-Bigelow, RN,BSN [phone]  

## 2013-08-14 NOTE — ED Provider Notes (Signed)
Medical screening examination/treatment/procedure(s) were performed by non-physician practitioner and as supervising physician I was immediately available for consultation/collaboration.   EKG Interpretation   Date/Time:  Wednesday August 13 2013 05:10:32 EDT Ventricular Rate:  60 PR Interval:  132 QRS Duration: 82 QT Interval:  408 QTC Calculation: 408 R Axis:   78 Text Interpretation:  Normal sinus rhythm Normal ECG Artifact When  compared with ECG of 12/09/2007 No significant change was found Confirmed  by [facility]  MD, Master Touchet ([id number]) on 08/13/2013 8:45:45 AM        Francine Graven, DO 08/14/13 1652
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
## 2013-08-15 DIAGNOSIS — S52509A Unspecified fracture of the lower end of unspecified radius, initial encounter for closed fracture: Secondary | ICD-10-CM | POA: Diagnosis not present

## 2013-08-15 DIAGNOSIS — S5290XA Unspecified fracture of unspecified forearm, initial encounter for closed fracture: Secondary | ICD-10-CM

## 2013-08-15 MED ORDER — POLYETHYLENE GLYCOL 3350 17 G PO PACK: 17.0000 g | PACK | Freq: Every day | ORAL | Status: DC

## 2013-08-15 MED ORDER — OXYCODONE HCL 15 MG PO TABS: 15.0000 mg | ORAL_TABLET | ORAL | Status: DC | PRN

## 2013-08-15 MED ORDER — ONDANSETRON HCL 4 MG PO TABS: 4.0000 mg | ORAL_TABLET | Freq: Four times a day (QID) | ORAL | Status: DC | PRN

## 2013-08-15 NOTE — Progress Notes (Signed)
UR Completed Ross Hefferan Graves-Bigelow, RN,BSN [phone]  

## 2013-08-15 NOTE — Discharge Instructions (Signed)
Elevate hand Move fingers Keep splint dry  No driving till cleared by primary MD or primary orthopedic MD  Follow with Primary MD  Barbette Merino, MD  and other consultant as instructed your Hospitalist MD  Please get a complete blood count and chemistry panel checked by your Primary MD at your next visit, and again as instructed by your Primary MD.  Get Medicines reviewed and adjusted. Please take all your medications with you for your next visit with your Primary MD  Please request your Primary MD to go over all hospital tests and procedure/radiological results at the follow up, please ask your Primary MD to get all Hospital records sent to his/her office.  If you experience worsening of your admission symptoms, develop shortness of breath, life threatening emergency, suicidal or homicidal thoughts you must seek medical attention immediately by calling 911 or calling your MD immediately  if symptoms less severe.  You must read complete instructions/literature along with all the possible adverse reactions/side effects for all the Medicines you take and that have been prescribed to you. Take any new Medicines after you have completely understood and accpet all the possible adverse reactions/side effects.   Do not drive when taking Pain medications.   Do not take more than prescribed Pain, Sleep and Anxiety Medications  Special Instructions: If you have smoked or chewed Tobacco  in the last 2 yrs please stop smoking, stop any regular Alcohol  and or any Recreational drug use.  Wear Seat belts while driving.  Please note  You were cared for by a hospitalist during your hospital stay. Once you are discharged, your primary care physician will handle any further medical issues. Please note that NO REFILLS for any discharge medications will be authorized once you are discharged, as it is imperative that you return to your primary care physician (or establish a relationship with a primary care  physician if you do not have one) for your aftercare needs so that they can reassess your need for medications and monitor your lab values.
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
## 2013-08-15 NOTE — Progress Notes (Signed)
Isaac Barnes to be D/C'd Home per MD order.  Discussed with the patient and all questions fully answered.    Medication List    STOP taking these medications       oxyCODONE-acetaminophen 7.5-325 MG per tablet  Commonly known as:  PERCOCET      TAKE these medications       diazepam 5 MG tablet  Commonly known as:  VALIUM  Take 5 mg by mouth 3 (three) times daily as needed for anxiety.     ondansetron 4 MG tablet  Commonly known as:  ZOFRAN  Take 1 tablet (4 mg total) by mouth every 6 (six) hours as needed for nausea.     oxyCODONE 15 MG immediate release tablet  Commonly known as:  ROXICODONE  Take 1 tablet (15 mg total) by mouth every 4 (four) hours as needed for moderate pain.     polyethylene glycol packet  Commonly known as:  MIRALAX / GLYCOLAX  Take 17 g by mouth daily.        VVS, Skin clean, dry and intact without evidence of skin break down, no evidence of skin tears noted. IV catheter discontinued intact. Site without signs and symptoms of complications. Dressing and pressure applied.  An After Visit Summary was printed and given to the patient.  D/c education completed with patient/family including follow up instructions, medication list, d/c activities limitations if indicated, with other d/c instructions as indicated by MD - patient able to verbalize understanding, all questions fully answered.   Patient instructed to return to ED, call 911, or call MD for any changes in condition.   Patient escorted via [name], and D/C home via private auto.  [name] D 08/15/2013 11:25 AM

## 2013-08-19 LAB — VITAMIN D 1,25 DIHYDROXY
VITAMIN D 1, 25 (OH) TOTAL: 83 pg/mL — AB (ref 18–72)
VITAMIN D3 1, 25 (OH): 83 pg/mL
Vitamin D2 1, 25 (OH)2: <8 pg/mL

## 2013-12-20 ENCOUNTER — Other Ambulatory Visit: Payer: Self-pay | Admitting: Nurse Practitioner

## 2013-12-31 ENCOUNTER — Ambulatory Visit: Payer: Medicare Other | Admitting: Family Medicine

## 2014-03-20 ENCOUNTER — Ambulatory Visit: Payer: Medicare Other | Admitting: Family Medicine

## 2014-08-25 ENCOUNTER — Telehealth: Payer: Self-pay | Admitting: *Deleted

## 2014-08-25 NOTE — Telephone Encounter (Signed)
PT. HAS NOT SEEN DR.MOHAMED SINCE 2012. DR.MOHAMED'S NURSE, MARY GARNER,RN HAS SPOKEN WITH PT.

## 2014-08-25 NOTE — Telephone Encounter (Signed)
Pt called with following complaints: " stinging all over body, white dots on back of hands, 2 cavities to upper teeth, ears have fluid running out of them." Discussed with pt after review with MD, pt has not been seen in over 4 years, pt will need to see PCP and Dentist for these concerns. Pt verbalized understanding. No further concerns.

## 2014-10-19 ENCOUNTER — Encounter (HOSPITAL_COMMUNITY): Payer: Self-pay | Admitting: *Deleted

## 2014-10-19 ENCOUNTER — Emergency Department (HOSPITAL_COMMUNITY): Payer: Medicare Other

## 2014-10-19 ENCOUNTER — Emergency Department (HOSPITAL_COMMUNITY)
Admission: EM | Admit: 2014-10-19 | Discharge: 2014-10-19 | Disposition: A | Discharge status: Home / Self Care | Payer: Medicare Other | Attending: Emergency Medicine | Admitting: Emergency Medicine

## 2014-10-19 DIAGNOSIS — Z880 Allergy status to penicillin: Secondary | ICD-10-CM | POA: Insufficient documentation

## 2014-10-19 DIAGNOSIS — Z87828 Personal history of other (healed) physical injury and trauma: Secondary | ICD-10-CM | POA: Diagnosis not present

## 2014-10-19 DIAGNOSIS — Z87442 Personal history of urinary calculi: Secondary | ICD-10-CM | POA: Diagnosis not present

## 2014-10-19 DIAGNOSIS — Z8572 Personal history of non-Hodgkin lymphomas: Secondary | ICD-10-CM | POA: Insufficient documentation

## 2014-10-19 DIAGNOSIS — K219 Gastro-esophageal reflux disease without esophagitis: Secondary | ICD-10-CM | POA: Diagnosis not present

## 2014-10-19 DIAGNOSIS — Z8659 Personal history of other mental and behavioral disorders: Secondary | ICD-10-CM | POA: Insufficient documentation

## 2014-10-19 DIAGNOSIS — R1013 Epigastric pain: Secondary | ICD-10-CM | POA: Insufficient documentation

## 2014-10-19 DIAGNOSIS — Z79899 Other long term (current) drug therapy: Secondary | ICD-10-CM | POA: Diagnosis not present

## 2014-10-19 DIAGNOSIS — Z9049 Acquired absence of other specified parts of digestive tract: Secondary | ICD-10-CM | POA: Insufficient documentation

## 2014-10-19 DIAGNOSIS — R112 Nausea with vomiting, unspecified: Secondary | ICD-10-CM | POA: Diagnosis not present

## 2014-10-19 DIAGNOSIS — Z720 Tobacco use: Secondary | ICD-10-CM | POA: Diagnosis not present

## 2014-10-19 LAB — COMPREHENSIVE METABOLIC PANEL
ALT: 14 U/L — ABNORMAL LOW (ref 17–63)
ANION GAP: 7 (ref 5–15)
AST: 22 U/L (ref 15–41)
Albumin: 3.8 g/dL (ref 3.5–5.0)
Alkaline Phosphatase: 60 U/L (ref 38–126)
BUN: 12 mg/dL (ref 6–20)
CHLORIDE: 103 mmol/L (ref 101–111)
CO2: 29 mmol/L (ref 22–32)
Calcium: 9 mg/dL (ref 8.9–10.3)
Creatinine, Ser: 0.93 mg/dL (ref 0.61–1.24)
GFR calc non Af Amer: >60 mL/min (ref >60)
GLUCOSE: 141 mg/dL — AB (ref 65–99)
POTASSIUM: 3.8 mmol/L (ref 3.5–5.1)
SODIUM: 139 mmol/L (ref 135–145)
TOTAL PROTEIN: 6.6 g/dL (ref 6.5–8.1)
Total Bilirubin: 0.5 mg/dL (ref 0.3–1.2)

## 2014-10-19 LAB — URINALYSIS, ROUTINE W REFLEX MICROSCOPIC
Glucose, UA: NEGATIVE mg/dL
Hgb urine dipstick: NEGATIVE
Ketones, ur: NEGATIVE mg/dL
Leukocytes, UA: NEGATIVE
NITRITE: NEGATIVE
Protein, ur: NEGATIVE mg/dL
SPECIFIC GRAVITY, URINE: 1.04 — AB (ref 1.005–1.030)
UROBILINOGEN UA: 1 mg/dL (ref 0.0–1.0)
pH: 5 (ref 5.0–8.0)

## 2014-10-19 LAB — CBC
HEMATOCRIT: 41.1 % (ref 39.0–52.0)
HEMOGLOBIN: 14.3 g/dL (ref 13.0–17.0)
MCH: 30.4 pg (ref 26.0–34.0)
MCHC: 34.8 g/dL (ref 30.0–36.0)
MCV: 87.3 fL (ref 78.0–100.0)
Platelets: 170 10*3/uL (ref 150–400)
RBC: 4.71 MIL/uL (ref 4.22–5.81)
RDW: 12.3 % (ref 11.5–15.5)
WBC: 16.5 10*3/uL — AB (ref 4.0–10.5)

## 2014-10-19 LAB — I-STAT TROPONIN, ED: TROPONIN I, POC: 0.01 ng/mL (ref 0.00–0.08)

## 2014-10-19 LAB — LIPASE, BLOOD: LIPASE: 20 U/L — AB (ref 22–51)

## 2014-10-19 MED ORDER — ONDANSETRON 4 MG PO TBDP: 4.0000 mg | ORAL_TABLET | Freq: Three times a day (TID) | ORAL | Status: DC | PRN

## 2014-10-19 MED ORDER — SUCRALFATE 1 G PO TABS: 1.0000 g | ORAL_TABLET | Freq: Three times a day (TID) | ORAL | Status: DC

## 2014-10-19 MED ORDER — ONDANSETRON 4 MG PO TBDP
4.0000 mg | ORAL_TABLET | Freq: Once | ORAL | Status: AC
Start: 2014-10-19 — End: 2014-10-19
  Administered 2014-10-19: 4 mg via ORAL
  Filled 2014-10-19: qty 1

## 2014-10-19 MED ORDER — GI COCKTAIL ~~LOC~~
30.0000 mL | Freq: Once | ORAL | Status: AC
  Administered 2014-10-19: 30 mL via ORAL
  Filled 2014-10-19: qty 30

## 2014-10-19 NOTE — ED Notes (Signed)
I CALLED PATIENT NAME AND NO ONE RESPONDED.

## 2014-10-19 NOTE — Discharge Instructions (Signed)
1. Medications: protonix, carafate, zofran, usual home medications 2. Treatment: rest, drink plenty of fluids 3. Follow Up: please followup with your primary doctor and your gastroenterologist for discussion of your diagnoses and further evaluation after today's visit; please return to the ER for severe pain, high fever, persistent vomiting, new or worsening symptoms   Abdominal Pain, Adult Many things can cause abdominal pain. Usually, abdominal pain is not caused by a disease and will improve without treatment. It can often be observed and treated at home. Your health care provider will do a physical exam and possibly order blood tests and X-rays to help determine the seriousness of your pain. However, in many cases, more time must pass before a clear cause of the pain can be found. Before that point, your health care provider may not know if you need more testing or further treatment. HOME CARE INSTRUCTIONS Monitor your abdominal pain for any changes. The following actions may help to alleviate any discomfort you are experiencing:  Only take over-the-counter or prescription medicines as directed by your health care provider.  Do not take laxatives unless directed to do so by your health care provider.  Try a clear liquid diet (broth, tea, or water) as directed by your health care provider. Slowly move to a bland diet as tolerated. SEEK MEDICAL CARE IF:  You have unexplained abdominal pain.  You have abdominal pain associated with nausea or diarrhea.  You have pain when you urinate or have a bowel movement.  You experience abdominal pain that wakes you in the night.  You have abdominal pain that is worsened or improved by eating food.  You have abdominal pain that is worsened with eating fatty foods.  You have a fever. SEEK IMMEDIATE MEDICAL CARE IF:  Your pain does not go away within 2 hours.  You keep throwing up (vomiting).  Your pain is felt only in portions of the abdomen,  such as the right side or the left lower portion of the abdomen.  You pass bloody or black tarry stools. MAKE SURE YOU:  Understand these instructions.  Will watch your condition.  Will get help right away if you are not doing well or get worse.   This information is not intended to replace advice given to you by your health care provider. Make sure you discuss any questions you have with your health care provider.   Document Released: 09/28/2004 Document Revised: 09/09/2014 Document Reviewed: 08/28/2012 Elsevier Interactive Patient Education 2016 [employer]  Nausea and Vomiting Nausea is a sick feeling that often comes before throwing up (vomiting). Vomiting is a reflex where stomach contents come out of your mouth. Vomiting can cause severe loss of body fluids (dehydration). Children and elderly adults can become dehydrated quickly, especially if they also have diarrhea. Nausea and vomiting are symptoms of a condition or disease. It is important to find the cause of your symptoms. CAUSES   Direct irritation of the stomach lining. This irritation can result from increased acid production (gastroesophageal reflux disease), infection, food poisoning, taking certain medicines (such as nonsteroidal anti-inflammatory drugs), alcohol use, or tobacco use.  Signals from the brain.These signals could be caused by a headache, heat exposure, an inner ear disturbance, increased pressure in the brain from injury, infection, a tumor, or a concussion, pain, emotional stimulus, or metabolic problems.  An obstruction in the gastrointestinal tract (bowel obstruction).  Illnesses such as diabetes, hepatitis, gallbladder problems, appendicitis, kidney problems, cancer, sepsis, atypical symptoms of a heart attack, or  eating disorders.  Medical treatments such as chemotherapy and radiation.  Receiving medicine that makes you sleep (general anesthetic) during surgery. DIAGNOSIS Your caregiver may ask  for tests to be done if the problems do not improve after a few days. Tests may also be done if symptoms are severe or if the reason for the nausea and vomiting is not clear. Tests may include:  Urine tests.  Blood tests.  Stool tests.  Cultures (to look for evidence of infection).  X-rays or other imaging studies. Test results can help your caregiver make decisions about treatment or the need for additional tests. TREATMENT You need to stay well hydrated. Drink frequently but in small amounts.You may wish to drink water, sports drinks, clear broth, or eat frozen ice pops or gelatin dessert to help stay hydrated.When you eat, eating slowly may help prevent nausea.There are also some antinausea medicines that may help prevent nausea. HOME CARE INSTRUCTIONS   Take all medicine as directed by your caregiver.  If you do not have an appetite, do not force yourself to eat. However, you must continue to drink fluids.  If you have an appetite, eat a normal diet unless your caregiver tells you differently.  Eat a variety of complex carbohydrates (rice, wheat, potatoes, bread), lean meats, yogurt, fruits, and vegetables.  Avoid high-fat foods because they are more difficult to digest.  Drink enough water and fluids to keep your urine clear or pale yellow.  If you are dehydrated, ask your caregiver for specific rehydration instructions. Signs of dehydration may include:  Severe thirst.  Dry lips and mouth.  Dizziness.  Dark urine.  Decreasing urine frequency and amount.  Confusion.  Rapid breathing or pulse. SEEK IMMEDIATE MEDICAL CARE IF:   You have blood or brown flecks (like coffee grounds) in your vomit.  You have black or bloody stools.  You have a severe headache or stiff neck.  You are confused.  You have severe abdominal pain.  You have chest pain or trouble breathing.  You do not urinate at least once every 8 hours.  You develop cold or clammy skin.  You  continue to vomit for longer than 24 to 48 hours.  You have a fever. MAKE SURE YOU:   Understand these instructions.  Will watch your condition.  Will get help right away if you are not doing well or get worse.   This information is not intended to replace advice given to you by your health care provider. Make sure you discuss any questions you have with your health care provider.   Document Released: 12/19/2004 Document Revised: 03/13/2011 Document Reviewed: 05/18/2010 Elsevier Interactive Patient Education [employer].

## 2014-10-19 NOTE — ED Notes (Signed)
Attempted to call pt back to triage room, however no response from the lobby.
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
## 2014-10-19 NOTE — ED Notes (Addendum)
Pt acting "sleepy" while nurse trying to triage.  Pt reports he is not sleepy, but nodding off while talking to nurse. Pt reports upper abd pain/ chest pain and vomiting x2 days. Pain 8/10. Reports this morning started having itching all over and then started vomiting, and then headache started.   Currently has follicular lymphoma, but not receiving chemo at this time.   Pt ambulated to bathroom independently.

## 2015-04-02 ENCOUNTER — Encounter (HOSPITAL_COMMUNITY): Payer: Self-pay

## 2015-04-02 ENCOUNTER — Emergency Department (HOSPITAL_COMMUNITY): Payer: Medicare Other

## 2015-04-02 ENCOUNTER — Emergency Department (HOSPITAL_COMMUNITY)
Admission: EM | Admit: 2015-04-02 | Discharge: 2015-04-02 | Disposition: A | Discharge status: Home / Self Care | Payer: Medicare Other | Attending: Emergency Medicine | Admitting: Emergency Medicine

## 2015-04-02 DIAGNOSIS — Z880 Allergy status to penicillin: Secondary | ICD-10-CM | POA: Insufficient documentation

## 2015-04-02 DIAGNOSIS — R1032 Left lower quadrant pain: Secondary | ICD-10-CM | POA: Insufficient documentation

## 2015-04-02 DIAGNOSIS — F17210 Nicotine dependence, cigarettes, uncomplicated: Secondary | ICD-10-CM | POA: Diagnosis not present

## 2015-04-02 DIAGNOSIS — Z8659 Personal history of other mental and behavioral disorders: Secondary | ICD-10-CM | POA: Diagnosis not present

## 2015-04-02 DIAGNOSIS — R197 Diarrhea, unspecified: Secondary | ICD-10-CM | POA: Diagnosis present

## 2015-04-02 DIAGNOSIS — Z8719 Personal history of other diseases of the digestive system: Secondary | ICD-10-CM | POA: Diagnosis not present

## 2015-04-02 DIAGNOSIS — Z9049 Acquired absence of other specified parts of digestive tract: Secondary | ICD-10-CM | POA: Insufficient documentation

## 2015-04-02 DIAGNOSIS — R112 Nausea with vomiting, unspecified: Secondary | ICD-10-CM | POA: Diagnosis not present

## 2015-04-02 DIAGNOSIS — Z87442 Personal history of urinary calculi: Secondary | ICD-10-CM | POA: Insufficient documentation

## 2015-04-02 DIAGNOSIS — Z8572 Personal history of non-Hodgkin lymphomas: Secondary | ICD-10-CM | POA: Diagnosis not present

## 2015-04-02 DIAGNOSIS — Z8781 Personal history of (healed) traumatic fracture: Secondary | ICD-10-CM | POA: Diagnosis not present

## 2015-04-02 DIAGNOSIS — R1110 Vomiting, unspecified: Secondary | ICD-10-CM

## 2015-04-02 LAB — URINALYSIS, ROUTINE W REFLEX MICROSCOPIC
Bilirubin Urine: NEGATIVE
Glucose, UA: NEGATIVE mg/dL
Hgb urine dipstick: NEGATIVE
Ketones, ur: NEGATIVE mg/dL
LEUKOCYTES UA: NEGATIVE
NITRITE: NEGATIVE
Protein, ur: NEGATIVE mg/dL
SPECIFIC GRAVITY, URINE: 1.022 (ref 1.005–1.030)
pH: 6.5 (ref 5.0–8.0)

## 2015-04-02 LAB — CBC
HCT: 42.8 % (ref 39.0–52.0)
HEMOGLOBIN: 15.2 g/dL (ref 13.0–17.0)
MCH: 29.4 pg (ref 26.0–34.0)
MCHC: 35.5 g/dL (ref 30.0–36.0)
MCV: 82.8 fL (ref 78.0–100.0)
Platelets: 166 10*3/uL (ref 150–400)
RBC: 5.17 MIL/uL (ref 4.22–5.81)
RDW: 12.4 % (ref 11.5–15.5)
WBC: 9.4 10*3/uL (ref 4.0–10.5)

## 2015-04-02 LAB — COMPREHENSIVE METABOLIC PANEL
ALK PHOS: 64 U/L (ref 38–126)
ALT: 21 U/L (ref 17–63)
ANION GAP: 9 (ref 5–15)
AST: 31 U/L (ref 15–41)
Albumin: 4.6 g/dL (ref 3.5–5.0)
BILIRUBIN TOTAL: 0.8 mg/dL (ref 0.3–1.2)
BUN: 10 mg/dL (ref 6–20)
CALCIUM: 9.9 mg/dL (ref 8.9–10.3)
CO2: 23 mmol/L (ref 22–32)
Chloride: 106 mmol/L (ref 101–111)
Creatinine, Ser: 0.77 mg/dL (ref 0.61–1.24)
GFR calc non Af Amer: >60 mL/min (ref >60)
Glucose, Bld: 117 mg/dL — ABNORMAL HIGH (ref 65–99)
Potassium: 3.2 mmol/L — ABNORMAL LOW (ref 3.5–5.1)
SODIUM: 138 mmol/L (ref 135–145)
TOTAL PROTEIN: 7.7 g/dL (ref 6.5–8.1)

## 2015-04-02 LAB — LIPASE, BLOOD: Lipase: 49 U/L (ref 11–51)

## 2015-04-02 LAB — MAGNESIUM: MAGNESIUM: 1.9 mg/dL (ref 1.7–2.4)

## 2015-04-02 MED ORDER — SODIUM CHLORIDE 0.9 % IV BOLUS (SEPSIS)
1000.0000 mL | Freq: Once | INTRAVENOUS | Status: AC
  Administered 2015-04-02: 1000 mL via INTRAVENOUS

## 2015-04-02 MED ORDER — ONDANSETRON 4 MG PO TBDP: ORAL_TABLET | ORAL | Status: DC

## 2015-04-02 MED ORDER — POTASSIUM CHLORIDE CRYS ER 20 MEQ PO TBCR
40.0000 meq | EXTENDED_RELEASE_TABLET | Freq: Once | ORAL | Status: AC
  Administered 2015-04-02: 40 meq via ORAL
  Filled 2015-04-02: qty 2

## 2015-04-02 MED ORDER — MAGNESIUM OXIDE 400 (241.3 MG) MG PO TABS
800.0000 mg | ORAL_TABLET | Freq: Once | ORAL | Status: AC
  Administered 2015-04-02: 800 mg via ORAL
  Filled 2015-04-02: qty 2

## 2015-04-02 MED ORDER — IOHEXOL 300 MG/ML  SOLN
25.0000 mL | Freq: Once | INTRAMUSCULAR | Status: AC | PRN
  Administered 2015-04-02: 25 mL via ORAL

## 2015-04-02 MED ORDER — ONDANSETRON HCL 4 MG/2ML IJ SOLN
4.0000 mg | Freq: Once | INTRAMUSCULAR | Status: AC
  Administered 2015-04-02: 4 mg via INTRAVENOUS
  Filled 2015-04-02: qty 2

## 2015-04-02 MED ORDER — MORPHINE SULFATE (PF) 4 MG/ML IV SOLN
4.0000 mg | Freq: Once | INTRAVENOUS | Status: AC
  Administered 2015-04-02: 4 mg via INTRAVENOUS
  Filled 2015-04-02: qty 1

## 2015-04-02 MED ORDER — IOPAMIDOL (ISOVUE-300) INJECTION 61%
100.0000 mL | Freq: Once | INTRAVENOUS | Status: AC | PRN
  Administered 2015-04-02: 100 mL via INTRAVENOUS

## 2015-04-02 MED ORDER — ALUM & MAG HYDROXIDE-SIMETH 200-200-20 MG/5ML PO SUSP
15.0000 mL | Freq: Once | ORAL | Status: AC
  Administered 2015-04-02: 15 mL via ORAL
  Filled 2015-04-02: qty 30

## 2015-04-02 NOTE — ED Notes (Signed)
Patient states that he has had 3 x bowel movements today. Last Saturday, this started with diarrhea and vomiting. Patient states he has not had his pain medication in three days.

## 2015-04-02 NOTE — ED Notes (Signed)
Patient drunk water and took pills without any problems.

## 2015-04-02 NOTE — ED Notes (Signed)
Pt has lymphoma.  Pt has been taking pain meds for years.  For last 3 days, pt stopped his meds.  Pt has not felt well.  Having diarrhea.  Didn't want to take his meds.  Pt with weakness and does not know if he has had fever.

## 2015-04-02 NOTE — ED Notes (Signed)
Requested patient to urinate. 
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
## 2015-04-02 NOTE — Discharge Instructions (Signed)
Community Resource Guide Outpatient Counseling/Substance Abuse Adolescent [facility] [employer] [facility] is a great source of information about community services available.  Access by dialing 2-1-1 from anywhere in [place], or by website -  CustodianSupply.fi.   Other Local Resources (Updated 01/2015)  [facility]  Crisis Hotline, available 24 hours a day, 7 days a week: [place], [place]   [facility]  Crisis Hotline, available 24 hours a day, 7 days a week: [place], [place]  [facility]  Suicide Prevention Hotline, available 24 hours a day, 7 days a week: [place], [place], available 24 hours a day, 7 days a week: [place], [place] Access to [employer], available 24 hours a day, 7 days a week: [phone] All   [facility]  Crisis Hotline, available 24 hours a day, 7 days a week: [phone] All   Other Local Resources (Updated 01/2015)  Outpatient Counseling/ Substance Abuse Programs  Services     Address and Phone Number  [facility]  Offers individual counseling [phone] [address], [place], [place] [address]  [place] Medicare, private pay, and private insurance [phone] [address] [place], [place] [address]  [facility]  Provides individual counseling, substance abuse intensive outpatient program (several hours a day, several days a week), day treatment program, and school-based therapy  [place], Medicaid, private insurance [phone] [address], [place], [place] [address]  [facility]  Offers individual counseling, family counseling, group  therapy, substance abuse intensive outpatient program (several hours a day, several days a week), and a partial hospitalization program [phone] [address] [place], [place] [address]  [phone] [address], [place] [address]  [phone] [place], [place] [address]  [phone] [place] [address] [place], [place] [address]  [facility]  Offers individual and family counseling  Accepts Medicaid and private insurance  Offers a sliding scale for uninsured [phone] [address] [place], [place] [address]  [place] private insurance [phone] [address], [place], [place] [address]  [facility] individual counseling and intensive in-home services [phone] [address], [place], [place] [address]  [facility] individual counseling, family counseling, group therapy, domestic violence counseling  Accepts Medicaid and private insurance  Offers sliding scale for uninsured [phone] [address] [place], [place] [address]  [phone] [facility], [address] [place], [place]  [facility]  Offers individual counseling, family counseling group therapy, and school-based therapy  3 locations - [place], [place], and [place]  [place] [address] [place], [place] [address]  [address] [place], [place] [address]  [place], [place] [address]  [facility]  Offers individual and family counseling  Accepts [place] and private insurance  Offers sliding scale for uninsured [phone] [address] [place], [place] [address]  Launa Flight, MD  Accepts private insurance [phone] [address] [place],  [address]  [facility]   Offers outpatient substance abuse counseling, intensive outpatient substance  abuse programs (several hours a day, several days a week), and residential  substance abuse treatment  [phone], or [phone] [address], [phone] , [place] private insurance [phone] [place], [place] [address]  [place] Medicare and private insurance [phone] [address] [place], [place] [address]  [facility]    Offers intensive outpatient program (several hours a day, several days a week)  Accepts private pay and private insurance [phone] [place], [place]    Offers intensive outpatient program (several hours a day, several days a week), and partial hospitalization program [phone] [place], [place] [address]  [facility] counseling  Offers individual and family counseling  Accepts private insurance  Offers sliding scale for uninsured [phone] [place], [place] [address]  [facility]  Christian counseling [phone] [address] [place], [place] [address]  [facility]  Offers individual and family counseling  Offers LGBTQ services  Accepts private insurance and private pay [phone] [address] [place], [place] [address]  [facility] individual and family counseling  Accepts private insurance [phone] [address] [place], [place] [address]  [facility]   Adolescent Substance Abuse Program (ASAP): [phone]  [facility] [employer] Structured Day Program: [phone] [place], [place] children ages 38 - 37 and their families  Offers intensive in-home treatment and residential programs [phone] [address] [place], [place] [address]

## 2015-04-06 ENCOUNTER — Telehealth: Payer: Self-pay | Admitting: Internal Medicine

## 2015-04-06 NOTE — Telephone Encounter (Signed)
Faxed requested records to [facility]

## 2015-06-23 ENCOUNTER — Emergency Department (HOSPITAL_COMMUNITY)
Admission: EM | Admit: 2015-06-23 | Discharge: 2015-06-23 | Disposition: A | Discharge status: Home / Self Care | Payer: Medicare Other | Attending: Dermatology | Admitting: Dermatology

## 2015-06-23 ENCOUNTER — Encounter (HOSPITAL_COMMUNITY): Payer: Self-pay

## 2015-06-23 DIAGNOSIS — M25551 Pain in right hip: Secondary | ICD-10-CM | POA: Insufficient documentation

## 2015-06-23 DIAGNOSIS — F17210 Nicotine dependence, cigarettes, uncomplicated: Secondary | ICD-10-CM | POA: Insufficient documentation

## 2015-06-23 DIAGNOSIS — Z5321 Procedure and treatment not carried out due to patient leaving prior to being seen by health care provider: Secondary | ICD-10-CM | POA: Insufficient documentation

## 2015-06-23 DIAGNOSIS — F329 Major depressive disorder, single episode, unspecified: Secondary | ICD-10-CM | POA: Insufficient documentation

## 2015-06-23 NOTE — ED Notes (Signed)
He is awake and is ambulatory in his room.  He tells me he is "going to go home--I'll call a cab".  He proceeds to ambulate to our waiting room.  I assure him we will be happy to provide him care; but he proceeds to the lobby.
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
## 2015-11-06 ENCOUNTER — Emergency Department (HOSPITAL_COMMUNITY)
Admission: EM | Admit: 2015-11-06 | Discharge: 2015-11-07 | Disposition: A | Discharge status: Home / Self Care | Payer: Medicare Other | Attending: Emergency Medicine | Admitting: Emergency Medicine

## 2015-11-06 ENCOUNTER — Encounter (HOSPITAL_COMMUNITY): Payer: Self-pay | Admitting: Emergency Medicine

## 2015-11-06 DIAGNOSIS — F19939 Other psychoactive substance use, unspecified with withdrawal, unspecified: Secondary | ICD-10-CM | POA: Insufficient documentation

## 2015-11-06 DIAGNOSIS — R112 Nausea with vomiting, unspecified: Secondary | ICD-10-CM | POA: Insufficient documentation

## 2015-11-06 DIAGNOSIS — H6063 Unspecified chronic otitis externa, bilateral: Secondary | ICD-10-CM

## 2015-11-06 DIAGNOSIS — F17210 Nicotine dependence, cigarettes, uncomplicated: Secondary | ICD-10-CM | POA: Diagnosis not present

## 2015-11-06 DIAGNOSIS — F1123 Opioid dependence with withdrawal: Secondary | ICD-10-CM | POA: Insufficient documentation

## 2015-11-06 DIAGNOSIS — Z79899 Other long term (current) drug therapy: Secondary | ICD-10-CM | POA: Insufficient documentation

## 2015-11-06 LAB — COMPREHENSIVE METABOLIC PANEL
ALT: 17 U/L (ref 17–63)
AST: 17 U/L (ref 15–41)
Albumin: 4.3 g/dL (ref 3.5–5.0)
Alkaline Phosphatase: 56 U/L (ref 38–126)
Anion gap: 7 (ref 5–15)
BILIRUBIN TOTAL: 0.7 mg/dL (ref 0.3–1.2)
BUN: 11 mg/dL (ref 6–20)
CALCIUM: 9.5 mg/dL (ref 8.9–10.3)
CO2: 27 mmol/L (ref 22–32)
CREATININE: 0.82 mg/dL (ref 0.61–1.24)
Chloride: 103 mmol/L (ref 101–111)
GFR calc Af Amer: >60 mL/min (ref >60)
GFR calc non Af Amer: >60 mL/min (ref >60)
Glucose, Bld: 128 mg/dL — ABNORMAL HIGH (ref 65–99)
POTASSIUM: 3.3 mmol/L — AB (ref 3.5–5.1)
Sodium: 137 mmol/L (ref 135–145)
TOTAL PROTEIN: 6.9 g/dL (ref 6.5–8.1)

## 2015-11-06 LAB — CBC
HCT: 37.4 % — ABNORMAL LOW (ref 39.0–52.0)
Hemoglobin: 13.1 g/dL (ref 13.0–17.0)
MCH: 29.9 pg (ref 26.0–34.0)
MCHC: 35 g/dL (ref 30.0–36.0)
MCV: 85.4 fL (ref 78.0–100.0)
PLATELETS: 159 10*3/uL (ref 150–400)
RBC: 4.38 MIL/uL (ref 4.22–5.81)
RDW: 12.2 % (ref 11.5–15.5)
WBC: 10.5 10*3/uL (ref 4.0–10.5)

## 2015-11-06 LAB — ACETAMINOPHEN LEVEL: Acetaminophen (Tylenol), Serum: <10 ug/mL — ABNORMAL LOW (ref 10–30)

## 2015-11-06 LAB — SALICYLATE LEVEL: Salicylate Lvl: <7 mg/dL (ref 2.8–30.0)

## 2015-11-06 LAB — ETHANOL
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
## 2015-11-06 NOTE — ED Triage Notes (Signed)
Patient called EMS to go to [facility] for detox. Patient vomited in EMS truck and started hearing things. Patient is trying to detox off of Oxycodone.

## 2015-11-06 NOTE — ED Notes (Signed)
Bed: WA07 Expected date:  Expected time:  Means of arrival:  Comments: 

## 2015-11-07 LAB — RAPID URINE DRUG SCREEN, HOSP PERFORMED
Amphetamines: NOT DETECTED
Barbiturates: NOT DETECTED
Benzodiazepines: POSITIVE — AB
Cocaine: NOT DETECTED
Opiates: NOT DETECTED
Tetrahydrocannabinol: NOT DETECTED

## 2015-11-07 LAB — LIPASE, BLOOD: LIPASE: 31 U/L (ref 11–51)

## 2015-11-07 MED ORDER — CIPROFLOXACIN-DEXAMETHASONE 0.3-0.1 % OT SUSP: 4.0000 [drp] | Freq: Two times a day (BID) | OTIC | 0 refills | Status: AC

## 2016-02-04 ENCOUNTER — Ambulatory Visit (INDEPENDENT_AMBULATORY_CARE_PROVIDER_SITE_OTHER): Payer: Medicare Other | Admitting: Neurology

## 2016-02-04 ENCOUNTER — Encounter: Payer: Self-pay | Admitting: Neurology

## 2016-02-04 VITALS — BP 120/74 | HR 87 | Ht 66.0 in | Wt 141.3 lb

## 2016-02-04 DIAGNOSIS — G250 Essential tremor: Secondary | ICD-10-CM

## 2016-02-04 DIAGNOSIS — R519 Headache, unspecified: Secondary | ICD-10-CM

## 2016-02-04 DIAGNOSIS — M5481 Occipital neuralgia: Secondary | ICD-10-CM | POA: Diagnosis not present

## 2016-02-04 DIAGNOSIS — Z8579 Personal history of other malignant neoplasms of lymphoid, hematopoietic and related tissues: Secondary | ICD-10-CM

## 2016-02-04 DIAGNOSIS — R51 Headache: Secondary | ICD-10-CM | POA: Diagnosis not present

## 2016-02-04 MED ORDER — BUPIVACAINE HCL (PF) 0.5 % IJ SOLN: 3.0000 mL | Freq: Once | INTRAMUSCULAR | Status: DC

## 2016-02-04 MED ORDER — BUPIVACAINE-EPINEPHRINE 0.25% -1:200000 IJ SOLN
3.0000 mL | Freq: Once | INTRAMUSCULAR | Status: AC
  Administered 2016-02-04: 3 mL

## 2016-02-04 MED ORDER — TRIAMCINOLONE ACETONIDE 40 MG/ML IJ SUSP (RADIOLOGY)
40.0000 mg | Freq: Once | INTRAMUSCULAR | Status: AC
  Administered 2016-02-04: 40 mg via INTRAMUSCULAR

## 2016-02-04 MED ORDER — PROPRANOLOL HCL 20 MG PO TABS: 20.0000 mg | ORAL_TABLET | Freq: Two times a day (BID) | ORAL | 3 refills | Status: DC

## 2016-02-04 NOTE — Procedures (Signed)
Procedure: Occipital nerve block  Procedure risks, benefits and alternative treatments explained to patient and they agree to proceed.   A concentration of 0.25% bupivacaine (3 ml) was mixed with 40 mg of Kenalog (1 ml). A 27 gauge needle was used for injection. The region of the right greater and lesser occipital nerve was located by palpation. The area was prepped with alcohol. A total of 3 cc of the above mixture was injected without difficulty, 2cc to the greater occipital region and 1cc to the lesser occipital region. The patient tolerated the procedure well.    Donika K. Posey Pronto, DO
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
## 2016-02-04 NOTE — Patient Instructions (Addendum)
1.  MRI brain wwo contrast 2.  Start propranolol 20mg  twice daily 3.  Try to limit your tylenol  4.  If your wish to proceed with a nerve block, please let me know  Return to clinic in 4 months

## 2016-02-21 ENCOUNTER — Ambulatory Visit
Admission: RE | Admit: 2016-02-21 | Discharge: 2016-02-21 | Disposition: A | Discharge status: Home / Self Care | Payer: Medicare Other | Source: Ambulatory Visit | Attending: Neurology | Admitting: Neurology

## 2016-02-21 DIAGNOSIS — R51 Headache: Secondary | ICD-10-CM

## 2016-02-21 DIAGNOSIS — Z8579 Personal history of other malignant neoplasms of lymphoid, hematopoietic and related tissues: Secondary | ICD-10-CM

## 2016-02-21 DIAGNOSIS — R519 Headache, unspecified: Secondary | ICD-10-CM

## 2016-02-21 DIAGNOSIS — M5481 Occipital neuralgia: Secondary | ICD-10-CM

## 2016-02-21 MED ORDER — GADOBENATE DIMEGLUMINE 529 MG/ML IV SOLN
12.0000 mL | Freq: Once | INTRAVENOUS | Status: AC | PRN
  Administered 2016-02-21: 12 mL via INTRAVENOUS

## 2016-02-23 ENCOUNTER — Telehealth: Payer: Self-pay | Admitting: *Deleted

## 2016-02-23 NOTE — Telephone Encounter (Signed)
-----   Message from Alda Berthold, DO sent at 02/22/2016 12:54 PM EST ----- Please inform patient that there are some age-related changes on the brain, but nothing worrisome to explain headaches.  Thanks.

## 2016-02-23 NOTE — Telephone Encounter (Signed)
Patient given the results.  

## 2016-03-31 ENCOUNTER — Encounter: Payer: Self-pay | Admitting: Internal Medicine

## 2016-04-04 ENCOUNTER — Other Ambulatory Visit: Payer: Self-pay | Admitting: Medical Oncology

## 2016-04-04 DIAGNOSIS — C8290 Follicular lymphoma, unspecified, unspecified site: Secondary | ICD-10-CM

## 2016-04-24 ENCOUNTER — Ambulatory Visit: Payer: Medicare Other | Admitting: Internal Medicine

## 2016-04-24 ENCOUNTER — Other Ambulatory Visit: Payer: Medicare Other

## 2016-05-02 ENCOUNTER — Telehealth: Payer: Self-pay | Admitting: Internal Medicine

## 2016-05-02 NOTE — Telephone Encounter (Signed)
Pt called to r/s missed appts. Gave pt next available date/time

## 2016-05-19 ENCOUNTER — Other Ambulatory Visit: Payer: Self-pay | Admitting: Medical Oncology

## 2016-05-19 DIAGNOSIS — C8290 Follicular lymphoma, unspecified, unspecified site: Secondary | ICD-10-CM

## 2016-05-22 ENCOUNTER — Other Ambulatory Visit: Payer: Medicare Other

## 2016-05-22 ENCOUNTER — Telehealth: Payer: Self-pay | Admitting: *Deleted

## 2016-05-22 ENCOUNTER — Ambulatory Visit: Payer: Medicare Other | Admitting: Internal Medicine

## 2016-05-22 NOTE — Telephone Encounter (Signed)
Oncology Nurse Navigator Documentation  Oncology Nurse Navigator Flowsheets 05/22/2016  Navigator Encounter Type Telephone  Telephone Outgoing Call/per Dr. Worthy Flank request, I called Mr. Holan and cancelled his appt with Dr. Julien Nordmann today. I updated patient that he will get a call with another appt. I will notify scheduling to call and re-schedule.   Treatment Phase Follow-up  Barriers/Navigation Needs Coordination of Care  Interventions Coordination of Care  Coordination of Care Appts  Acuity Level 2  Time Spent with Patient 30

## 2016-06-26 ENCOUNTER — Ambulatory Visit: Payer: Medicare Other | Admitting: Neurology

## 2017-02-15 ENCOUNTER — Other Ambulatory Visit: Payer: Self-pay | Admitting: Neurology

## 2019-01-18 ENCOUNTER — Ambulatory Visit: Payer: Medicare Other | Attending: Internal Medicine

## 2019-01-18 DIAGNOSIS — Z23 Encounter for immunization: Secondary | ICD-10-CM

## 2019-01-18 NOTE — Progress Notes (Signed)
   Covid-19 Vaccination Clinic  Name:  Isaac Barnes    MRN: [id number] DOB: November 06, 1950  01/18/2019  Isaac Barnes was observed post Covid-19 immunization for 15 minutes without incidence. He was provided with Vaccine Information Sheet and instruction to access the V-Safe system.   Isaac Barnes was instructed to call 911 with any severe reactions post vaccine: Marland Kitchen Difficulty breathing  . Swelling of your face and throat  . A fast heartbeat  . A bad rash all over your body  . Dizziness and weakness    Immunizations Administered    Name Date Dose VIS Date Route   Pfizer COVID-19 Vaccine 01/18/2019  1:32 PM 0.3 mL 12/13/2018 Intramuscular   Manufacturer: [place]   Lot: [id number]   [place]: [id number]

## 2019-02-05 ENCOUNTER — Ambulatory Visit: Payer: Medicare Other

## 2019-02-05 ENCOUNTER — Ambulatory Visit: Payer: Medicare Other | Attending: Internal Medicine

## 2019-02-05 DIAGNOSIS — Z23 Encounter for immunization: Secondary | ICD-10-CM | POA: Insufficient documentation

## 2019-02-05 NOTE — Progress Notes (Signed)
   Covid-19 Vaccination Clinic  Name:  Isaac Barnes    MRN: [id number] DOB: Mar 22, 1950  02/05/2019  [name] was observed post Covid-19 immunization for 15 minutes without incidence. He was provided with Vaccine Information Sheet and instruction to access the V-Safe system.   [name] was instructed to call 911 with any severe reactions post vaccine: Marland Kitchen Difficulty breathing  . Swelling of your face and throat  . A fast heartbeat  . A bad rash all over your body  . Dizziness and weakness    Immunizations Administered    Name Date Dose VIS Date Route   Pfizer COVID-19 Vaccine 02/05/2019  9:23 AM 0.3 mL 12/13/2018 Intramuscular   Manufacturer: [place]   Lot: [id number]   [place]: [id number]

## 2019-11-26 ENCOUNTER — Ambulatory Visit (INDEPENDENT_AMBULATORY_CARE_PROVIDER_SITE_OTHER): Payer: Medicare Other

## 2019-11-26 ENCOUNTER — Ambulatory Visit
Admission: EM | Admit: 2019-11-26 | Discharge: 2019-11-26 | Disposition: A | Discharge status: Home / Self Care | Payer: Medicare Other | Attending: Family Medicine | Admitting: Family Medicine

## 2019-11-26 ENCOUNTER — Other Ambulatory Visit: Payer: Self-pay

## 2019-11-26 ENCOUNTER — Encounter: Payer: Self-pay | Admitting: Family Medicine

## 2019-11-26 DIAGNOSIS — T50905A Adverse effect of unspecified drugs, medicaments and biological substances, initial encounter: Secondary | ICD-10-CM | POA: Diagnosis not present

## 2019-11-26 DIAGNOSIS — N62 Hypertrophy of breast: Secondary | ICD-10-CM | POA: Diagnosis not present

## 2019-11-26 DIAGNOSIS — R0789 Other chest pain: Secondary | ICD-10-CM

## 2019-11-26 DIAGNOSIS — R079 Chest pain, unspecified: Secondary | ICD-10-CM

## 2019-11-26 DIAGNOSIS — K4090 Unilateral inguinal hernia, without obstruction or gangrene, not specified as recurrent: Secondary | ICD-10-CM

## 2019-11-26 DIAGNOSIS — R0602 Shortness of breath: Secondary | ICD-10-CM

## 2019-11-26 NOTE — ED Triage Notes (Signed)
Patient states he has been experiencing chest pain since this am worsening today. Pt states he has had chest pain over the last week and previously but denies any significant cardiac history. Pt also complains of pain radiating to the right arm. Pt is aox4 and ambulatory.

## 2019-11-26 NOTE — Discharge Instructions (Addendum)
You need to see your primary care doctor (Dr. Jonelle Sidle) about reducing your narcotics.  I believe they are causing some of the swelling around her nipples and making your chest tender.  Ask Dr. Jonelle Sidle for referral for the hernia
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
## 2019-12-04 ENCOUNTER — Ambulatory Visit: Payer: Self-pay | Admitting: Surgery

## 2019-12-04 ENCOUNTER — Encounter: Payer: Self-pay | Admitting: Surgery

## 2019-12-04 NOTE — H&P (Signed)
Isaac Barnes Appointment: 12/04/2019 3:15 PM Location: [facility] Patient #: [id number] DOB: Dec 17, 1950 Single / Language: Cleophus Molt / Race: White Male  History of Present Illness Adin Hector MD; 12/04/2019 4:41 PM) The patient is a 69 year old male who presents with an inguinal hernia. Note for "Inguinal hernia": ` ` ` Patient sent for surgical consultation at the request of Dr Jonelle Sidle  Chief Complaint: Hernia ` ` The patient is a gentleman with history of COPD and lymphoma. Smoker. Had left groin pain and discomfort and found to have hernia. Surgical consultation offered in 2019. 2 years later patient returned primary care physician noting hernia still there. No not easily reducible. BM daily but does strain. Surgical consultation offered again.  Patient notes he's had worsening groin discomfort and swelling this year. Now uncomfortable to reduce. He has to get up to urinate several times at night. He can walk a half hour without difficulty. He does smoke tobacco but claims he does only about 4 cigarettes a day. Moderate active. Not diabetic. No cardiac or pulmonary issues. No COPD. No blood thinners. No sleep apnea that he is aware of. History of MRSA or skin infections. He is not on any chemotherapy at this time.  (Review of systems as stated in this history (HPI) or in the review of systems. Otherwise all other 12 point ROS are negative) ` ` ###########################################`  This patient encounter took 30 minutes today to perform the following: obtain history, perform exam, review outside records, interpret tests & imaging, counsel the patient on their diagnosis; and, document this encounter, including findings & plan in the electronic health record (EHR).   Past Surgical History Antonietta Jewel, [place]; 12/04/2019 3:20 PM) Appendectomy  Diagnostic Studies History (Chanel Teressa Senter, [place]; 12/04/2019 3:20 PM) Colonoscopy 5-10 years  ago  Allergies (Chanel Teressa Senter, CMA; 12/04/2019 3:21 PM) Cephalosporins Erythromycin *DERMATOLOGICALS* Penicillins Sulfa Antibiotics Allergies Reconciled  Medication History (Chanel Teressa Senter, CMA; 12/04/2019 3:22 PM) Propranolol HCl (20MG  Tablet, Oral) Active. Medications Reconciled  Social History Antonietta Jewel, CMA; 12/04/2019 3:20 PM) Alcohol use Remotely quit alcohol use. Caffeine use Carbonated beverages. Tobacco use Current some day smoker.  Other Problems (Chanel Teressa Senter, CMA; 12/04/2019 3:20 PM) Anxiety Disorder Cancer Depression Gastroesophageal Reflux Disease High blood pressure Sleep Apnea     Review of Systems ([place]; 12/04/2019 3:20 PM) General Present- Appetite Loss, Fatigue and Weight Loss. Not Present- Chills, Fever, Night Sweats and Weight Gain. Skin Not Present- Change in Wart/Mole, Dryness, Hives, Jaundice, New Lesions, Non-Healing Wounds, Rash and Ulcer. HEENT Present- Hearing Loss. Not Present- Earache, Hoarseness, Nose Bleed, Oral Ulcers, Ringing in the Ears, Seasonal Allergies, Sinus Pain, Sore Throat, Visual Disturbances, Wears glasses/contact lenses and Yellow Eyes. Respiratory Present- Snoring. Not Present- Bloody sputum, Chronic Cough, Difficulty Breathing and Wheezing. Breast Present- Breast Pain. Not Present- Breast Mass, Nipple Discharge and Skin Changes. Cardiovascular Present- Shortness of Breath. Not Present- Chest Pain, Difficulty Breathing Lying Down, Leg Cramps, Palpitations, Rapid Heart Rate and Swelling of Extremities. Gastrointestinal Present- Abdominal Pain, Difficulty Swallowing and Indigestion. Not Present- Bloating, Bloody Stool, Change in Bowel Habits, Chronic diarrhea, Constipation, Excessive gas, Gets full quickly at meals, Hemorrhoids, Nausea, Rectal Pain and Vomiting. Male Genitourinary Present- Frequency and Urgency. Not Present- Blood in Urine, Change in Urinary Stream, Impotence, Nocturia, Painful Urination and  Urine Leakage. Musculoskeletal Not Present- Back Pain, Joint Pain, Joint Stiffness, Muscle Pain, Muscle Weakness and Swelling of Extremities. Neurological Present- Decreased Memory. Not Present- Fainting, Headaches, Numbness, Seizures, Tingling, Tremor, Trouble walking  and Weakness. Psychiatric Present- Change in Sleep Pattern and Depression. Not Present- Anxiety, Bipolar, Fearful and Frequent crying. Endocrine Not Present- Cold Intolerance, Excessive Hunger, Hair Changes, Heat Intolerance, Hot flashes and New Diabetes. Hematology Present- Easy Bruising. Not Present- Blood Thinners, Excessive bleeding, Gland problems, HIV and Persistent Infections.  Vitals (Chanel Nolan CMA; 12/04/2019 3:22 PM) 12/04/2019 3:22 PM Weight: 138.25 lb Height: 66in Body Surface Area: 1.71 m Body Mass Index: 22.31 kg/m  Temp.: 98.68F  Pulse: 89 (Regular)  BP: 126/82(Sitting, Left Arm, Standard)        Physical Exam Adin Hector MD; 12/04/2019 3:49 PM)  General Mental Status-Alert. General Appearance-Not in acute distress, Not Sickly. Orientation-Oriented X3. Hydration-Well hydrated. Voice-Normal.  Integumentary Global Assessment Upon inspection and palpation of skin surfaces of the - Axillae: non-tender, no inflammation or ulceration, no drainage. and Distribution of scalp and body hair is normal. General Characteristics Temperature - normal warmth is noted.  Head and Neck Head-normocephalic, atraumatic with no lesions or palpable masses. Face Global Assessment - atraumatic, no absence of expression. Neck Global Assessment - no abnormal movements, no bruit auscultated on the right, no bruit auscultated on the left, no decreased range of motion, non-tender. Trachea-midline. Thyroid Gland Characteristics - non-tender.  Eye Eyeball - Left-Extraocular movements intact, No Nystagmus - Left. Eyeball - Right-Extraocular movements intact, No Nystagmus - Right. Cornea  - Left-No Hazy - Left. Cornea - Right-No Hazy - Right. Sclera/Conjunctiva - Left-No scleral icterus, No Discharge - Left. Sclera/Conjunctiva - Right-No scleral icterus, No Discharge - Right. Pupil - Left-Direct reaction to light normal. Pupil - Right-Direct reaction to light normal. Note: Wears glasses. Vision corrected  ENMT Ears Pinna - Left - no drainage observed, no generalized tenderness observed. Pinna - Right - no drainage observed, no generalized tenderness observed. Nose and Sinuses External Inspection of the Nose - no destructive lesion observed. Inspection of the nares - Left - quiet respiration. Inspection of the nares - Right - quiet respiration. Mouth and Throat Lips - Upper Lip - no fissures observed, no pallor noted. Lower Lip - no fissures observed, no pallor noted. Nasopharynx - no discharge present. Oral Cavity/Oropharynx - Tongue - no dryness observed. Oral Mucosa - no cyanosis observed. Hypopharynx - no evidence of airway distress observed.  Chest and Lung Exam Inspection Movements - Normal and Symmetrical. Accessory muscles - No use of accessory muscles in breathing. Palpation Palpation of the chest reveals - Non-tender. Auscultation Breath sounds - Normal and Clear.  Cardiovascular Auscultation Rhythm - Regular. Murmurs & Other Heart Sounds - Auscultation of the heart reveals - No Murmurs and No Systolic Clicks.  Abdomen Inspection Inspection of the abdomen reveals - No Visible peristalsis and No Abnormal pulsations. Umbilicus - No Bleeding, No Urine drainage. Palpation/Percussion Palpation and Percussion of the abdomen reveal - Soft, Non Tender, No Rebound tenderness, No Rigidity (guarding) and No Cutaneous hyperesthesia. Note: Abdomen soft. Nontender. Not distended. No umbilical or incisional hernias. No guarding.  Male Genitourinary Sexual Maturity Tanner 5 - Adult hair pattern and Adult penile size and shape. Note: Obvious left groin  bulge. Small but sensitive inguinal hernia reducible. Right side definite impulse on cough suspicious for right inguinal hernia as well.  Cords epididymides testes otherwise without any obvious masses. Circumcised male.  Peripheral Vascular Upper Extremity Inspection - Left - No Cyanotic nailbeds - Left, Not Ischemic. Inspection - Right - No Cyanotic nailbeds - Right, Not Ischemic.  Neurologic Neurologic evaluation reveals -normal attention span and ability to concentrate, able to  name objects and repeat phrases. Appropriate fund of knowledge , normal sensation and normal coordination. Mental Status Affect - not angry, not paranoid. Cranial Nerves-Normal Bilaterally. Gait-Normal.  Neuropsychiatric Mental status exam performed with findings of-able to articulate well with normal speech/language, rate, volume and coherence, thought content normal with ability to perform basic computations and apply abstract reasoning and no evidence of hallucinations, delusions, obsessions or homicidal/suicidal ideation.  Musculoskeletal Global Assessment Spine, Ribs and Pelvis - no instability, subluxation or laxity. Right Upper Extremity - no instability, subluxation or laxity.  Lymphatic Head & Neck  General Head & Neck Lymphatics: Bilateral - Description - No Localized lymphadenopathy. Axillary  General Axillary Region: Bilateral - Description - No Localized lymphadenopathy. Femoral & Inguinal  Generalized Femoral & Inguinal Lymphatics: Left - Description - No Localized lymphadenopathy. Right - Description - No Localized lymphadenopathy.    Assessment & Plan Adin Hector MD; 12/04/2019 4:41 PM)  BILATERAL INGUINAL HERNIA WITHOUT OBSTRUCTION OR GANGRENE, RECURRENCE NOT SPECIFIED (K40.20) Impression: Obvious left inguinal hernia sensitive a reducible. And pulse with cough suspicious for right inguinal hernia as well.  Think he would benefit from laparoscopic exploration and repair  of hernias found. Most likely bilateral TEP preperitoneal mesh. He is interested in proceeding.   PREOP - ING HERNIA - ENCOUNTER FOR PREOPERATIVE EXAMINATION FOR GENERAL SURGICAL PROCEDURE (Z01.818)  Current Plans You are being scheduled for surgery- Our schedulers will call you.  You should hear from our office's scheduling department within 5 working days about the location, date, and time of surgery. We try to make accommodations for patient's preferences in scheduling surgery, but sometimes the OR schedule or the surgeon's schedule prevents [name] from making those accommodations.  If you have not heard from our office [phone]) in 5 working days, call the office and ask for your surgeon's nurse.  If you have other questions about your diagnosis, plan, or surgery, call the office and ask for your surgeon's nurse.  Written instructions provided The anatomy & physiology of the abdominal wall and pelvic floor was discussed. The pathophysiology of hernias in the inguinal and pelvic region was discussed. Natural history risks such as progressive enlargement, pain, incarceration, and strangulation was discussed. Contributors to complications such as smoking, obesity, diabetes, prior surgery, etc were discussed.  I feel the risks of no intervention will lead to serious problems that outweigh the operative risks; therefore, I recommended surgery to reduce and repair the hernia. I explained laparoscopic techniques with possible need for an open approach. I noted usual use of mesh to patch and/or buttress hernia repair  Risks such as bleeding, infection, abscess, need for further treatment, heart attack, death, and other risks were discussed. I noted a good likelihood this will help address the problem. Goals of post-operative recovery were discussed as well. Possibility that this will not correct all symptoms was explained. I stressed the importance of low-impact activity, aggressive  pain control, avoiding constipation, & not pushing through pain to minimize risk of post-operative chronic pain or injury. Possibility of reherniation was discussed. We will work to minimize complications.  An educational handout further explaining the pathology & treatment options was given as well. Questions were answered. The patient expresses understanding & wishes to proceed with surgery.  Pt Education - Pamphlet Given - Laparoscopic Hernia Repair: discussed with patient and provided information. Pt Education - [facility] Pain Control (Mikaelyn Arthurs) Pt Education - [facility] Hernia Post-Op HCI (Derreon Consalvo): discussed with patient and provided information. Pt Education - [facility] Mesh education: discussed with  patient and provided information.  TOBACCO ABUSE (Z72.0) Impression: STOP SMOKING!  We talked to the patient about the dangers of smoking. We stressed that tobacco use dramatically increases the risk of peri-operative complications such as infection, tissue necrosis leaving to problems with incision/wound and organ healing, hernia, chronic pain, heart attack, stroke, DVT, pulmonary embolism, and death. We noted there are programs in our community to help stop smoking. Information was available.  Current Plans Pt Education - [facility] STOP SMOKING!  Adin Hector, MD, FACS, MASCRS Gastrointestinal and Minimally Invasive Surgery  [facility] [address] [place], [place] [address] [phone] Fax [phone] Main/Paging  CONTACT INFORMATION: Weekday (9AM-5PM) concerns: Call [facility] main office at [phone] Weeknight (5PM-9AM) or Weekend/Holiday concerns: Check [email or web address] for General Surgery [facility] coverage (Please, do not use SecureChat as it is not reliable communication to operating surgeons for immediate patient care)

## 2020-01-09 ENCOUNTER — Other Ambulatory Visit: Payer: Self-pay

## 2020-01-09 ENCOUNTER — Encounter: Payer: Self-pay | Admitting: Emergency Medicine

## 2020-01-09 ENCOUNTER — Ambulatory Visit
Admission: EM | Admit: 2020-01-09 | Discharge: 2020-01-09 | Disposition: A | Discharge status: Home / Self Care | Payer: Medicare Other | Attending: Emergency Medicine | Admitting: Emergency Medicine

## 2020-01-09 DIAGNOSIS — R059 Cough, unspecified: Secondary | ICD-10-CM

## 2020-01-09 DIAGNOSIS — J22 Unspecified acute lower respiratory infection: Secondary | ICD-10-CM | POA: Diagnosis not present

## 2020-01-09 MED ORDER — ALBUTEROL SULFATE HFA 108 (90 BASE) MCG/ACT IN AERS
1.0000 | INHALATION_SPRAY | Freq: Four times a day (QID) | RESPIRATORY_TRACT | 0 refills | Status: AC | PRN
Start: 2020-01-09 — End: ?

## 2020-01-09 MED ORDER — DOXYCYCLINE HYCLATE 100 MG PO CAPS: 100.0000 mg | ORAL_CAPSULE | Freq: Two times a day (BID) | ORAL | 0 refills | Status: AC

## 2020-01-09 MED ORDER — BENZONATATE 200 MG PO CAPS: 200.0000 mg | ORAL_CAPSULE | Freq: Three times a day (TID) | ORAL | 0 refills | Status: AC | PRN

## 2020-01-09 MED ORDER — FLUTICASONE PROPIONATE 50 MCG/ACT NA SUSP: 1.0000 | Freq: Every day | NASAL | 0 refills | Status: DC

## 2020-01-09 MED ORDER — DM-GUAIFENESIN ER 30-600 MG PO TB12: 1.0000 | ORAL_TABLET | Freq: Two times a day (BID) | ORAL | 0 refills | Status: DC

## 2020-01-09 NOTE — ED Triage Notes (Signed)
Patient c/o productive cough w/ "yellow" sputum production, fatigue, headache, and runny nose x 5 days.   Patient endorses a temperature of 33F at home.    Patient has taken Mucinex w/ no relief of symptoms.
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
## 2020-01-09 NOTE — Discharge Instructions (Signed)
Covid test pending, should return in 2 to 4 days Begin doxycycline twice daily for 1 week Tessalon every 8 hours as needed for cough May use with Mucinex DM twice daily Flonase for sinus congestion and pressure Albuterol inhaler as needed for shortness of breath and wheezing Tylenol and ibuprofen for swollen left foot, warm compresses Follow-up if not improving or worsening

## 2020-01-13 LAB — NOVEL CORONAVIRUS, NAA: SARS-CoV-2, NAA: NOT DETECTED

## 2020-08-26 ENCOUNTER — Other Ambulatory Visit: Payer: Self-pay | Admitting: Physician Assistant

## 2020-08-26 DIAGNOSIS — K4190 Unilateral femoral hernia, without obstruction or gangrene, not specified as recurrent: Secondary | ICD-10-CM

## 2020-08-26 DIAGNOSIS — R1084 Generalized abdominal pain: Secondary | ICD-10-CM

## 2020-08-26 DIAGNOSIS — Z8572 Personal history of non-Hodgkin lymphomas: Secondary | ICD-10-CM

## 2020-08-27 ENCOUNTER — Ambulatory Visit
Admission: RE | Admit: 2020-08-27 | Discharge: 2020-08-27 | Disposition: A | Discharge status: Home / Self Care | Payer: Medicare Other | Source: Ambulatory Visit | Attending: Physician Assistant | Admitting: Physician Assistant

## 2020-08-27 DIAGNOSIS — Z8572 Personal history of non-Hodgkin lymphomas: Secondary | ICD-10-CM

## 2020-08-27 DIAGNOSIS — R1084 Generalized abdominal pain: Secondary | ICD-10-CM

## 2020-08-27 DIAGNOSIS — K4190 Unilateral femoral hernia, without obstruction or gangrene, not specified as recurrent: Secondary | ICD-10-CM

## 2020-08-27 MED ORDER — IOPAMIDOL (ISOVUE-300) INJECTION 61%
100.0000 mL | Freq: Once | INTRAVENOUS | Status: AC | PRN
  Administered 2020-08-27: 100 mL via INTRAVENOUS

## 2020-09-02 ENCOUNTER — Telehealth: Payer: Self-pay | Admitting: Medical Oncology

## 2020-09-02 NOTE — Telephone Encounter (Signed)
Pt referred to Dr Julien Nordmann for : L inguinal lymphadenopathy and possible breast mass. Hx Follicular lymphoma.

## 2020-09-09 ENCOUNTER — Telehealth: Payer: Self-pay | Admitting: Hematology and Oncology

## 2020-09-09 NOTE — Telephone Encounter (Signed)
Called pt to sch appt per staff msg from [place]. No answer and no vm available to leave a msg.

## 2020-12-02 ENCOUNTER — Telehealth: Payer: Self-pay | Admitting: Hematology

## 2020-12-02 NOTE — Telephone Encounter (Signed)
Scheduled appt per 11/29 referral. Pt is aware of appt date and time.  

## 2020-12-14 ENCOUNTER — Inpatient Hospital Stay: Payer: Medicare Other | Attending: Hematology | Admitting: Hematology

## 2023-07-01 ENCOUNTER — Other Ambulatory Visit: Payer: Self-pay

## 2023-07-01 ENCOUNTER — Ambulatory Visit
Admission: RE | Admit: 2023-07-01 | Discharge: 2023-07-01 | Disposition: A | Discharge status: Home / Self Care | Payer: Self-pay | Source: Ambulatory Visit

## 2023-07-01 VITALS — BP 164/86 | HR 68 | Temp 98.0°F | Resp 18

## 2023-07-01 DIAGNOSIS — M545 Low back pain, unspecified: Secondary | ICD-10-CM

## 2023-07-01 MED ORDER — TIZANIDINE HCL 4 MG PO TABS: 2.0000 mg | ORAL_TABLET | Freq: Four times a day (QID) | ORAL | 0 refills | Status: DC | PRN

## 2023-07-01 MED ORDER — PREDNISONE 20 MG PO TABS: 40.0000 mg | ORAL_TABLET | Freq: Every day | ORAL | 0 refills | Status: DC

## 2023-07-01 MED ORDER — PREDNISONE 20 MG PO TABS: 40.0000 mg | ORAL_TABLET | Freq: Every day | ORAL | 0 refills | Status: AC
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
## 2023-07-01 NOTE — ED Triage Notes (Signed)
 States he was mowing yard and mower got stuck in a hole. He was attempting to lift the mower wheel out of the hole and felt like a rubber band popped and went across my low back. Using salon-pas patches. States difficult to walk due to pain. He took percocet from an old prescription without relief

## 2023-08-06 ENCOUNTER — Emergency Department (HOSPITAL_COMMUNITY)
Admission: EM | Admit: 2023-08-06 | Discharge: 2023-08-06 | Disposition: A | Discharge status: Home / Self Care | Attending: Emergency Medicine | Admitting: Emergency Medicine

## 2023-08-06 ENCOUNTER — Encounter (HOSPITAL_COMMUNITY): Payer: Self-pay

## 2023-08-06 ENCOUNTER — Other Ambulatory Visit: Payer: Self-pay

## 2023-08-06 DIAGNOSIS — W25XXXA Contact with sharp glass, initial encounter: Secondary | ICD-10-CM | POA: Insufficient documentation

## 2023-08-06 DIAGNOSIS — Y93H2 Activity, gardening and landscaping: Secondary | ICD-10-CM | POA: Diagnosis not present

## 2023-08-06 DIAGNOSIS — S61411A Laceration without foreign body of right hand, initial encounter: Secondary | ICD-10-CM | POA: Diagnosis not present

## 2023-08-06 DIAGNOSIS — Z23 Encounter for immunization: Secondary | ICD-10-CM | POA: Insufficient documentation

## 2023-08-06 DIAGNOSIS — S6991XA Unspecified injury of right wrist, hand and finger(s), initial encounter: Secondary | ICD-10-CM | POA: Diagnosis present

## 2023-08-06 MED ORDER — TETANUS-DIPHTH-ACELL PERTUSSIS 5-2.5-18.5 LF-MCG/0.5 IM SUSY
0.5000 mL | PREFILLED_SYRINGE | Freq: Once | INTRAMUSCULAR | Status: AC
  Administered 2023-08-06: 0.5 mL via INTRAMUSCULAR
  Filled 2023-08-06: qty 0.5

## 2023-08-06 MED ORDER — BACITRACIN ZINC 500 UNIT/GM EX OINT
TOPICAL_OINTMENT | Freq: Two times a day (BID) | CUTANEOUS | Status: DC
  Administered 2023-08-06: 1 via TOPICAL
  Filled 2023-08-06: qty 0.9

## 2023-08-06 NOTE — ED Provider Notes (Signed)
 [facility]MENT AT [facility] Provider Note   CSN: [id number] Arrival date & time: 08/06/23  8040     Patient presents with: Hand Injury   Isaac Barnes is a 73 y.o. male who presents to the emergency department with a chief complaint of right hand laceration.  Patient states that on Saturday he was trimming some bushes outside and laid down his bush trimmers to pull out a clump of grass, patient then sliced his hand on the bush trimmers.  Denies blood thinner.  Patient has past medical history significant for follicular lymphoma, syncope, occipital neuralgia right eye, daily headaches, GERD, depression, etc. Unsure of when last tetanus vaccination was.     Hand Injury      Prior to Admission medications   Medication Sig Start Date End Date Taking? Authorizing Provider  albuterol  (VENTOLIN  HFA) 108 (90 Base) MCG/ACT inhaler Inhale 1-2 puffs into the lungs every 6 (six) hours as needed for wheezing or shortness of breath. Patient not taking: Reported on 07/01/2023 01/09/20   Wieters, Hallie C, PA-C  ALPRAZolam  (XANAX ) 1 MG tablet Take 1 mg by mouth at bedtime as needed. Patient not taking: Reported on 07/01/2023 03/07/12   [provider]  dextromethorphan-guaiFENesin  (MUCINEX  DM) 30-600 MG 12hr tablet Take 1 tablet by mouth 2 (two) times daily. Patient not taking: Reported on 07/01/2023 01/09/20   Wieters, Hallie C, PA-C  fluticasone  (FLONASE ) 50 MCG/ACT nasal spray Place 1-2 sprays into both nostrils daily. Patient not taking: Reported on 07/01/2023 01/09/20   Wieters, Hallie C, PA-C  ondansetron  (ZOFRAN  ODT) 4 MG disintegrating tablet Take 1 tablet (4 mg total) by mouth every 8 (eight) hours as needed for nausea. Patient not taking: Reported on 07/01/2023 10/19/14   Veronica Almarie BROCKS, PA-C  oxyCODONE  (ROXICODONE ) 15 MG immediate release tablet Take 1 tablet (15 mg total) by mouth every 4 (four) hours as needed for moderate pain. Patient not taking:  Reported on 07/01/2023 08/15/13   Raenelle Donalda HERO, MD  oxyCODONE -acetaminophen  (PERCOCET) 10-325 MG tablet Take 1 tablet by mouth every 6 (six) hours as needed for pain.  03/29/15   [provider]  propranolol  (INDERAL ) 20 MG tablet take 1 tablet by mouth twice a day Patient not taking: Reported on 07/01/2023 02/16/17   Patel, Donika K, DO  tiZANidine  (ZANAFLEX ) 4 MG tablet Take 0.5-1 tablets (2-4 mg total) by mouth every 6 (six) hours as needed for muscle spasms. 07/01/23   Billy Asberry FALCON, PA-C  sucralfate  (CARAFATE ) 1 G tablet Take 1 tablet (1 g total) by mouth 4 (four) times daily -  with meals and at bedtime. 10/19/14 11/26/19  Veronica Almarie BROCKS, PA-C    Allergies: Cephalosporins, Erythromycin, Penicillins, and Sulfa antibiotics    Review of Systems  Skin:  Positive for wound (well approximated superficial laceration of R hand).    Updated Vital Signs BP (!) 166/85 (BP Location: Right Arm)   Pulse 79   Temp 97.9 F (36.6 C) (Oral)   Resp 17   Ht 5' 6 (1.676 m)   Wt 59 kg   SpO2 99%   BMI 20.98 kg/m   Physical Exam Vitals and nursing note reviewed.  Constitutional:      General: He is not in acute distress.    Appearance: Normal appearance. He is not ill-appearing, toxic-appearing or diaphoretic.  HENT:     Head: Normocephalic and atraumatic.  Eyes:     General: No scleral icterus. Musculoskeletal:  General: Normal range of motion.     Right lower leg: No edema.     Left lower leg: No edema.  Skin:    General: Skin is warm.     Capillary Refill: Capillary refill takes less than 2 seconds.     Comments: Approximately inch and a half laceration present to dorsum of right hand, very superficial, not bleeding, no signs of infection  Neurological:     General: No focal deficit present.     Mental Status: He is alert and oriented to person, place, and time.  Psychiatric:        Mood and Affect: Mood normal.        Behavior: Behavior normal.      (all labs ordered are listed, but only abnormal results are displayed) Labs Reviewed - No data to display  EKG: None  Radiology: No results found.   Procedures   Medications Ordered in the ED  bacitracin  ointment (1 Application Topical Given 08/06/23 2158)  Tdap (BOOSTRIX ) injection 0.5 mL (0.5 mLs Intramuscular Given 08/06/23 2157)                                    Medical Decision Making Risk OTC drugs. Prescription drug management.    Patient presents to the ED for concern of right hand laceration, this involves an extensive number of treatment options, and is a complaint that carries with it a high risk of complications and morbidity.  The differential diagnosis includes laceration, retained foreign body, infection, cellulitis, tetanus, etc.   Co morbidities that complicate the patient evaluation  Follicular lymphoma, syncope, occipital neuralgia right eye, daily headaches, GERD, depression,  Medicines ordered and prescription drug management:  I ordered medication including tetanus for tetanus prophylaxis for hand laceration Reevaluation of the patient after these medicines showed that the patient stayed the same I have reviewed the patients home medicines and have made adjustments as needed   Test Considered:  none   Critical Interventions:  none   Problem List / ED Course:  73 year old male, superficial right dorsal hand laceration, nonbleeding, no blood thinner, normal range of motion, well-approximated On physical exam wound already well-approximated, no signs of infection, no need for laceration repair, will order tetanus booster, instructions given to keep area clean and dry, wash with soap and water Return precautions given Patient discharged   Reevaluation:  After the interventions noted above, I reevaluated the patient and found that they have :stayed the same   Social Determinants of Health:  none   Dispostion:  After  consideration of the diagnostic results and the patients response to treatment, I feel that the patent would benefit from discharge and continued monitoring of symptoms with supportive care at home.      Final diagnoses:  Laceration of right hand, foreign body presence unspecified, initial encounter    ED Discharge Orders     None          Janetta Terrall FALCON, PA-C 08/06/23 2248    Franklyn Sid SAILOR, MD 08/08/23 1510

## 2023-08-06 NOTE — ED Triage Notes (Signed)
 Pt was clipping grass along a fence yesterday. He laid the clippers down and pulled a clump of grass and his hand went right into the blade of the clippers. There is a cut along the lateral side of the right hand.

## 2023-08-06 NOTE — Discharge Instructions (Addendum)
 It was a pleasure taking care of you today.  Based on history and physical exam I feel you are safe for discharge.  Today your tetanus was updated and your laceration was rebandaged with bacitracin  ointment applied.  Please continue to keep the cut area clean and covered.  Please keep it dry as well.  Please wash it with soap and water while in the shower.  Please return to the emergency department or seek further medical care if you develop any signs consistent with infection including redness surrounding laceration, drainage from the area, severe pain, fever, chills, or other concerning symptom.  Otherwise recommend follow-up with primary care and specialist as scheduled, sooner if symptoms warrant.

## 2023-11-19 ENCOUNTER — Ambulatory Visit: Payer: Self-pay | Admitting: General Surgery

## 2023-11-23 ENCOUNTER — Encounter (HOSPITAL_COMMUNITY): Payer: Self-pay

## 2023-11-23 NOTE — Patient Instructions (Addendum)
 SURGICAL WAITING ROOM VISITATION Patients having surgery or a procedure may have no more than 2 support people in the waiting area - these visitors may rotate.    Children under the age of 8 must have an adult with them who is not the patient.  If the patient needs to stay at the hospital during part of their recovery, the visitor guidelines for inpatient rooms apply. Pre-op nurse will coordinate an appropriate time for 1 support person to accompany patient in pre-op.  This support person may not rotate.    Please refer to the [facility] website for the visitor guidelines for Inpatients (after your surgery is over and you are in a regular room).       Your procedure is scheduled on: 12-04-23   Report to [facility] Main Entrance    Report to admitting at 9:15 AM   Call this number if you have problems the morning of surgery [phone]   Do not eat food :After Midnight.   After Midnight you may have the following liquids until 8:30 AM DAY OF SURGERY  Water Non-Citrus Juices (without pulp, NO RED-Apple, White grape, White cranberry) Black Coffee (NO MILK/CREAM OR CREAMERS, sugar ok)  Clear Tea (NO MILK/CREAM OR CREAMERS, sugar ok) regular and decaf                             Plain Jell-O (NO RED)                                           Fruit ices (not with fruit pulp, NO RED)                                     Popsicles (NO RED)                                                               Sports drinks like Gatorade (NO RED)                   The day of surgery:  Drink ONE (1) Pre-Surgery Clear Ensure by 8:30 AM the morning of surgery. Drink in one sitting. Do not sip.  This drink was given to you during your hospital  pre-op appointment visit. Nothing else to drink after completing the Pre-Surgery Clear Ensure.          If you have questions, please contact your surgeon's office.   FOLLOW  ANY ADDITIONAL PRE OP INSTRUCTIONS YOU RECEIVED FROM YOUR SURGEON'S  OFFICE!!!   Oral Hygiene is also important to reduce your risk of infection.                                    Remember - BRUSH YOUR TEETH THE MORNING OF SURGERY WITH YOUR REGULAR TOOTHPASTE   Do NOT smoke after Midnight   Take these medicines the morning of surgery with A SIP OF WATER:    Oxycodone  if needed  Stop all  vitamins and herbal supplements 7 days before surgery                             You may not have any metal on your body including  jewelry, and body piercing             Do not wear  lotions, powders,  cologne, or deodorant              Men may shave face and neck.   Do not bring valuables to the hospital. [place] IS NOT RESPONSIBLE   FOR VALUABLES.   Contacts, dentures or bridgework may not be worn into surgery.  DO NOT BRING YOUR HOME MEDICATIONS TO THE HOSPITAL. PHARMACY WILL DISPENSE MEDICATIONS LISTED ON YOUR MEDICATION LIST TO YOU DURING YOUR ADMISSION IN THE HOSPITAL!    Patients discharged on the day of surgery will not be allowed to drive home.  Someone NEEDS to stay with you for the first 24 hours after anesthesia.   Special Instructions: Bring a copy of your healthcare power of attorney and living will documents the day of surgery if you haven't scanned them before.              Please read over the following fact sheets you were given: IF YOU HAVE QUESTIONS ABOUT YOUR PRE-OP INSTRUCTIONS PLEASE CALL [phone] [facility]  If you received a COVID test during your pre-op visit  it is requested that you wear a mask when out in public, stay away from anyone that may not be feeling well and notify your surgeon if you develop symptoms. If you test positive for Covid or have been in contact with anyone that has tested positive in the last 10 days please notify you surgeon.  [facility] - Preparing for Surgery Before surgery, you can play an important role.  Because skin is not sterile, your skin needs to be as free of germs as possible.  You can reduce the  number of germs on your skin by washing with CHG (chlorahexidine gluconate) soap before surgery.  CHG is an antiseptic cleaner which kills germs and bonds with the skin to continue killing germs even after washing. Please DO NOT use if you have an allergy to CHG or antibacterial soaps.  If your skin becomes reddened/irritated stop using the CHG and inform your nurse when you arrive at Short Stay. Do not shave (including legs and underarms) for at least 48 hours prior to the first CHG shower.  You may shave your face/neck.  Please follow these instructions carefully:  1.  Shower with CHG Soap the night before surgery ONLY (DO NOT USE THE SOAP THE MORNING OF SURGERY).  2.  If you choose to wash your hair, wash your hair first as usual with your normal  shampoo.  3.  After you shampoo, rinse your hair and body thoroughly to remove the shampoo.                             4.  Use CHG as you would any other liquid soap.  You can apply chg directly to the skin and wash.  Gently with a scrungie or clean washcloth.  5.  Apply the CHG Soap to your body ONLY FROM THE NECK DOWN.   Do   not use on face/ open  Wound or open sores. Avoid contact with eyes, ears mouth and   genitals (private parts).                       Wash face,  Genitals (private parts) with your normal soap.             6.  Wash thoroughly, paying special attention to the area where your    surgery  will be performed.  7.  Thoroughly rinse your body with warm water from the neck down.  8.  DO NOT shower/wash with your normal soap after using and rinsing off the CHG Soap.                9.  Pat yourself dry with a clean towel.            10.  Wear clean pajamas.            11.  Place clean sheets on your bed the night of your first shower and do not  sleep with pets. Day of Surgery : Do not apply any CHG, lotions/deodorants the morning of surgery.  Please wear clean clothes to the hospital/surgery center.  FAILURE  TO FOLLOW THESE INSTRUCTIONS MAY RESULT IN THE CANCELLATION OF YOUR SURGERY  PATIENT SIGNATURE_________________________________  NURSE SIGNATURE__________________________________  ________________________________________________________________________

## 2023-11-23 NOTE — Progress Notes (Signed)
 Date of COVID positive in last 90 days:  PCP - Emery Fuss, MD Cardiologist -   Chest x-ray - N/A EKG - N/A Stress Test - N/A ECHO - 08-14-13 Epic Cardiac Cath - N/A Pacemaker/ICD device last checked:N/A Spinal Cord Stimulator:N/A  Bowel Prep - N/A  Sleep Study - N/A CPAP -   Fasting Blood Sugar - N/A Checks Blood Sugar _____ times a day  Last dose of GLP1 agonist-  N/A GLP1 instructions:  Do not take after     Last dose of SGLT-2 inhibitors-  N/A SGLT-2 instructions:  Do not take after     Blood Thinner Instructions: N/A Last dose:   Time: Aspirin Instructions:N/A Last Dose:  Activity level:  Can go up a flight of stairs and perform activities of daily living without stopping and without symptoms of chest pain or shortness of breath.  Able to exercise without symptoms  Unable to go up a flight of stairs without symptoms of     Anesthesia review: N/A  Patient denies shortness of breath, fever, cough and chest pain at PAT appointment  Patient verbalized understanding of instructions that were given to them at the PAT appointment. Patient was also instructed that they will need to review over the PAT instructions again at home before surgery.

## 2023-11-28 ENCOUNTER — Encounter (HOSPITAL_COMMUNITY)
Admission: RE | Admit: 2023-11-28 | Discharge: 2023-11-28 | Disposition: A | Discharge status: Home / Self Care | Source: Ambulatory Visit | Attending: General Surgery | Admitting: General Surgery

## 2023-11-28 ENCOUNTER — Encounter (HOSPITAL_COMMUNITY): Payer: Self-pay

## 2023-11-28 ENCOUNTER — Other Ambulatory Visit: Payer: Self-pay

## 2023-11-28 VITALS — BP 138/79 | HR 89 | Temp 98.7°F | Resp 16 | Ht 66.0 in | Wt 129.4 lb

## 2023-11-28 DIAGNOSIS — Z01812 Encounter for preprocedural laboratory examination: Secondary | ICD-10-CM | POA: Diagnosis not present

## 2023-11-28 DIAGNOSIS — Z01818 Encounter for other preprocedural examination: Secondary | ICD-10-CM | POA: Diagnosis present

## 2023-11-28 HISTORY — DX: Personal history of urinary calculi: Z87.442

## 2023-11-28 HISTORY — DX: Unspecified osteoarthritis, unspecified site: M19.90

## 2023-11-28 LAB — CBC
HCT: 40 % (ref 39.0–52.0)
Hemoglobin: 13.6 g/dL (ref 13.0–17.0)
MCH: 30.4 pg (ref 26.0–34.0)
MCHC: 34 g/dL (ref 30.0–36.0)
MCV: 89.5 fL (ref 80.0–100.0)
Platelets: 158 K/uL (ref 150–400)
RBC: 4.47 MIL/uL (ref 4.22–5.81)
RDW: 12.1 % (ref 11.5–15.5)
WBC: 6.4 K/uL (ref 4.0–10.5)
nRBC: 0 % (ref 0.0–0.2)

## 2023-12-03 NOTE — Progress Notes (Signed)
 [facility]  PROCEDURAL EXPEDITER PROGRESS NOTE  Patient Name: Isaac Barnes  DOB:August 19, 1950 Date of Admission: (Not on file)  Date of Assessment:12/03/23   -------------------------------------------------------------------------------------------------------------------   Brief clinical summary: pt is22 yr old malehaving laparoscopic bilateral hernia inguinal repair  Orders in place:  No   Communication with surgical team if no orders: n/a  Labs, test, and orders reviewed: yes  Requires surgical clearance:  No  What type of clearance: n/a  Clearance received: n/a  Barriers noted:none   Intervention provided by [facility] team: n/a  Barrier resolved:  not applicable   -------------------------------------------------------------------------------------------------------------------  [employer], Ronal DELENA Bald Please contact us  directly via secure chat (search for [facility]) or by calling us  at [phone] [facility]).

## 2023-12-04 ENCOUNTER — Observation Stay (HOSPITAL_COMMUNITY)
Admission: EM | Admit: 2023-12-04 | Discharge: 2023-12-05 | Disposition: A | Attending: General Surgery | Admitting: General Surgery

## 2023-12-04 ENCOUNTER — Encounter (HOSPITAL_COMMUNITY): Admission: EM | Disposition: A | Payer: Self-pay | Source: Home / Self Care | Attending: General Surgery

## 2023-12-04 ENCOUNTER — Ambulatory Visit (HOSPITAL_COMMUNITY): Payer: Self-pay

## 2023-12-04 ENCOUNTER — Other Ambulatory Visit: Payer: Self-pay

## 2023-12-04 ENCOUNTER — Encounter (HOSPITAL_COMMUNITY): Payer: Self-pay | Admitting: General Surgery

## 2023-12-04 ENCOUNTER — Ambulatory Visit (HOSPITAL_COMMUNITY): Payer: Self-pay | Admitting: Medical

## 2023-12-04 DIAGNOSIS — K4020 Bilateral inguinal hernia, without obstruction or gangrene, not specified as recurrent: Secondary | ICD-10-CM

## 2023-12-04 DIAGNOSIS — Z87891 Personal history of nicotine dependence: Secondary | ICD-10-CM | POA: Insufficient documentation

## 2023-12-04 HISTORY — PX: INGUINAL HERNIA REPAIR: SHX194

## 2023-12-04 LAB — CBC
HCT: 36.4 % — ABNORMAL LOW (ref 39.0–52.0)
Hemoglobin: 12.2 g/dL — ABNORMAL LOW (ref 13.0–17.0)
MCH: 30.3 pg (ref 26.0–34.0)
MCHC: 33.5 g/dL (ref 30.0–36.0)
MCV: 90.5 fL (ref 80.0–100.0)
Platelets: 124 K/uL — ABNORMAL LOW (ref 150–400)
RBC: 4.02 MIL/uL — ABNORMAL LOW (ref 4.22–5.81)
RDW: 12 % (ref 11.5–15.5)
WBC: 8.1 K/uL (ref 4.0–10.5)
nRBC: 0 % (ref 0.0–0.2)

## 2023-12-04 LAB — CREATININE, SERUM
Creatinine, Ser: 1.29 mg/dL — ABNORMAL HIGH (ref 0.61–1.24)
GFR, Estimated: 59 mL/min — ABNORMAL LOW (ref >60)

## 2023-12-04 LAB — GLUCOSE, CAPILLARY: Glucose-Capillary: 125 mg/dL — ABNORMAL HIGH (ref 70–99)

## 2023-12-04 SURGERY — REPAIR, HERNIA, INGUINAL, LAPAROSCOPIC
Anesthesia: General | Laterality: Bilateral

## 2023-12-04 MED ORDER — ONDANSETRON HCL 4 MG/2ML IJ SOLN
INTRAMUSCULAR | Status: DC | PRN
  Administered 2023-12-04: 4 mg via INTRAVENOUS

## 2023-12-04 MED ORDER — STERILE WATER FOR IRRIGATION IR SOLN
Status: DC | PRN
  Administered 2023-12-04: 1000 mL

## 2023-12-04 MED ORDER — SUCCINYLCHOLINE CHLORIDE 200 MG/10ML IV SOSY
PREFILLED_SYRINGE | INTRAVENOUS | Status: AC
  Filled 2023-12-04: qty 10

## 2023-12-04 MED ORDER — ENSURE SURGERY PO LIQD
237.0000 mL | Freq: Two times a day (BID) | ORAL | Status: DC
  Administered 2023-12-04 – 2023-12-05 (×2): 237 mL via ORAL

## 2023-12-04 MED ORDER — FENTANYL CITRATE (PF) 50 MCG/ML IJ SOSY
25.0000 ug | PREFILLED_SYRINGE | INTRAMUSCULAR | Status: DC | PRN
  Administered 2023-12-04 (×3): 50 ug via INTRAVENOUS

## 2023-12-04 MED ORDER — ACETAMINOPHEN 500 MG PO TABS
1000.0000 mg | ORAL_TABLET | ORAL | Status: AC
  Administered 2023-12-04: 1000 mg via ORAL
  Filled 2023-12-04: qty 2

## 2023-12-04 MED ORDER — CELECOXIB 200 MG PO CAPS
200.0000 mg | ORAL_CAPSULE | Freq: Two times a day (BID) | ORAL | Status: DC
  Administered 2023-12-04 – 2023-12-05 (×3): 200 mg via ORAL
  Filled 2023-12-04 (×3): qty 1

## 2023-12-04 MED ORDER — PHENYLEPHRINE 80 MCG/ML (10ML) SYRINGE FOR IV PUSH (FOR BLOOD PRESSURE SUPPORT)
PREFILLED_SYRINGE | INTRAVENOUS | Status: AC
  Filled 2023-12-04: qty 10

## 2023-12-04 MED ORDER — ONDANSETRON 4 MG PO TBDP: 4.0000 mg | ORAL_TABLET | Freq: Four times a day (QID) | ORAL | Status: DC | PRN

## 2023-12-04 MED ORDER — ENOXAPARIN SODIUM 40 MG/0.4ML IJ SOSY
40.0000 mg | PREFILLED_SYRINGE | INTRAMUSCULAR | Status: DC
  Administered 2023-12-05: 40 mg via SUBCUTANEOUS
  Filled 2023-12-04: qty 0.4

## 2023-12-04 MED ORDER — SUCCINYLCHOLINE CHLORIDE 200 MG/10ML IV SOSY
PREFILLED_SYRINGE | INTRAVENOUS | Status: DC | PRN
  Administered 2023-12-04: 60 mg via INTRAVENOUS

## 2023-12-04 MED ORDER — MIDAZOLAM HCL 2 MG/2ML IJ SOLN
INTRAMUSCULAR | Status: AC
  Filled 2023-12-04: qty 2

## 2023-12-04 MED ORDER — 0.9 % SODIUM CHLORIDE (POUR BTL) OPTIME
TOPICAL | Status: DC | PRN
  Administered 2023-12-04: 1000 mL

## 2023-12-04 MED ORDER — KETOROLAC TROMETHAMINE 30 MG/ML IJ SOLN
INTRAMUSCULAR | Status: DC | PRN
  Administered 2023-12-04: 15 mg via INTRAVENOUS

## 2023-12-04 MED ORDER — LIDOCAINE HCL (CARDIAC) PF 100 MG/5ML IV SOSY
PREFILLED_SYRINGE | INTRAVENOUS | Status: DC | PRN
  Administered 2023-12-04: 60 mg via INTRAVENOUS

## 2023-12-04 MED ORDER — BUPIVACAINE-EPINEPHRINE (PF) 0.25% -1:200000 IJ SOLN
INTRAMUSCULAR | Status: AC
  Filled 2023-12-04: qty 30

## 2023-12-04 MED ORDER — ORAL CARE MOUTH RINSE: 15.0000 mL | Freq: Once | OROMUCOSAL | Status: AC

## 2023-12-04 MED ORDER — ONDANSETRON HCL 4 MG/2ML IJ SOLN
INTRAMUSCULAR | Status: AC
  Filled 2023-12-04: qty 2

## 2023-12-04 MED ORDER — CHLORHEXIDINE GLUCONATE CLOTH 2 % EX PADS: 6.0000 | MEDICATED_PAD | Freq: Once | CUTANEOUS | Status: DC

## 2023-12-04 MED ORDER — SIMETHICONE 80 MG PO CHEW: 40.0000 mg | CHEWABLE_TABLET | Freq: Four times a day (QID) | ORAL | Status: DC | PRN

## 2023-12-04 MED ORDER — AMISULPRIDE (ANTIEMETIC) 5 MG/2ML IV SOLN: 10.0000 mg | Freq: Once | INTRAVENOUS | Status: DC | PRN

## 2023-12-04 MED ORDER — LIDOCAINE HCL (PF) 2 % IJ SOLN
INTRAMUSCULAR | Status: AC
  Filled 2023-12-04: qty 5

## 2023-12-04 MED ORDER — EPHEDRINE 5 MG/ML INJ
INTRAVENOUS | Status: AC
  Filled 2023-12-04: qty 5

## 2023-12-04 MED ORDER — LIDOCAINE HCL 2 % IJ SOLN
INTRAMUSCULAR | Status: AC
  Filled 2023-12-04: qty 20

## 2023-12-04 MED ORDER — EPHEDRINE SULFATE (PRESSORS) 25 MG/5ML IV SOSY
PREFILLED_SYRINGE | INTRAVENOUS | Status: DC | PRN
  Administered 2023-12-04: 5 mg via INTRAVENOUS

## 2023-12-04 MED ORDER — SUGAMMADEX SODIUM 200 MG/2ML IV SOLN
INTRAVENOUS | Status: AC
  Filled 2023-12-04: qty 4

## 2023-12-04 MED ORDER — SUGAMMADEX SODIUM 200 MG/2ML IV SOLN
INTRAVENOUS | Status: DC | PRN
  Administered 2023-12-04: 240 mg via INTRAVENOUS

## 2023-12-04 MED ORDER — FENTANYL CITRATE (PF) 100 MCG/2ML IJ SOLN
INTRAMUSCULAR | Status: DC | PRN
  Administered 2023-12-04: 100 ug via INTRAVENOUS

## 2023-12-04 MED ORDER — PHENYLEPHRINE 80 MCG/ML (10ML) SYRINGE FOR IV PUSH (FOR BLOOD PRESSURE SUPPORT)
PREFILLED_SYRINGE | INTRAVENOUS | Status: DC | PRN
  Administered 2023-12-04: 80 ug via INTRAVENOUS
  Administered 2023-12-04 (×2): 100 ug via INTRAVENOUS

## 2023-12-04 MED ORDER — ROCURONIUM BROMIDE 100 MG/10ML IV SOLN
INTRAVENOUS | Status: DC | PRN
  Administered 2023-12-04: 50 mg via INTRAVENOUS

## 2023-12-04 MED ORDER — DEXAMETHASONE SOD PHOSPHATE PF 10 MG/ML IJ SOLN
INTRAMUSCULAR | Status: DC | PRN
  Administered 2023-12-04: 5 mg via INTRAVENOUS

## 2023-12-04 MED ORDER — HYDROMORPHONE HCL 1 MG/ML IJ SOLN: 0.5000 mg | INTRAMUSCULAR | Status: DC | PRN

## 2023-12-04 MED ORDER — LACTATED RINGERS IV SOLN: INTRAVENOUS | Status: DC

## 2023-12-04 MED ORDER — FENTANYL CITRATE (PF) 100 MCG/2ML IJ SOLN
INTRAMUSCULAR | Status: AC
  Filled 2023-12-04: qty 2

## 2023-12-04 MED ORDER — PROPOFOL 10 MG/ML IV BOLUS
INTRAVENOUS | Status: DC | PRN
  Administered 2023-12-04: 200 mg via INTRAVENOUS

## 2023-12-04 MED ORDER — ROCURONIUM BROMIDE 10 MG/ML (PF) SYRINGE
PREFILLED_SYRINGE | INTRAVENOUS | Status: AC
  Filled 2023-12-04: qty 10

## 2023-12-04 MED ORDER — DEXMEDETOMIDINE HCL IN NACL 80 MCG/20ML IV SOLN
INTRAVENOUS | Status: DC | PRN
  Administered 2023-12-04: 8 ug via INTRAVENOUS

## 2023-12-04 MED ORDER — DIPHENHYDRAMINE HCL 12.5 MG/5ML PO ELIX: 12.5000 mg | ORAL_SOLUTION | Freq: Four times a day (QID) | ORAL | Status: DC | PRN

## 2023-12-04 MED ORDER — ENSURE PRE-SURGERY PO LIQD: 296.0000 mL | Freq: Once | ORAL | Status: DC

## 2023-12-04 MED ORDER — CLINDAMYCIN PHOSPHATE 900 MG/50ML IV SOLN
900.0000 mg | INTRAVENOUS | Status: AC
  Administered 2023-12-04: 900 mg via INTRAVENOUS
  Filled 2023-12-04: qty 50

## 2023-12-04 MED ORDER — KETOROLAC TROMETHAMINE 30 MG/ML IJ SOLN
INTRAMUSCULAR | Status: AC
  Filled 2023-12-04: qty 1

## 2023-12-04 MED ORDER — CHLORHEXIDINE GLUCONATE 0.12 % MT SOLN
15.0000 mL | Freq: Once | OROMUCOSAL | Status: AC
  Administered 2023-12-04: 15 mL via OROMUCOSAL

## 2023-12-04 MED ORDER — DIPHENHYDRAMINE HCL 50 MG/ML IJ SOLN: 12.5000 mg | Freq: Four times a day (QID) | INTRAMUSCULAR | Status: DC | PRN

## 2023-12-04 MED ORDER — ONDANSETRON HCL 4 MG/2ML IJ SOLN
4.0000 mg | Freq: Four times a day (QID) | INTRAMUSCULAR | Status: DC | PRN
  Administered 2023-12-05: 4 mg via INTRAVENOUS
  Filled 2023-12-04: qty 2

## 2023-12-04 MED ORDER — PROPOFOL 10 MG/ML IV BOLUS
INTRAVENOUS | Status: AC
  Filled 2023-12-04: qty 20

## 2023-12-04 MED ORDER — METOPROLOL TARTRATE 5 MG/5ML IV SOLN: 5.0000 mg | Freq: Four times a day (QID) | INTRAVENOUS | Status: DC | PRN

## 2023-12-04 MED ORDER — FENTANYL CITRATE (PF) 50 MCG/ML IJ SOSY
PREFILLED_SYRINGE | INTRAMUSCULAR | Status: AC
  Filled 2023-12-04: qty 3

## 2023-12-04 MED ORDER — TRAMADOL HCL 50 MG PO TABS
50.0000 mg | ORAL_TABLET | Freq: Four times a day (QID) | ORAL | Status: DC | PRN
  Administered 2023-12-04 – 2023-12-05 (×2): 50 mg via ORAL
  Filled 2023-12-04 (×2): qty 1

## 2023-12-04 MED ORDER — BUPIVACAINE-EPINEPHRINE (PF) 0.25% -1:200000 IJ SOLN
INTRAMUSCULAR | Status: DC | PRN
  Administered 2023-12-04: 30 mL via PERINEURAL

## 2023-12-04 MED ORDER — OXYCODONE HCL 5 MG PO TABS
5.0000 mg | ORAL_TABLET | ORAL | Status: DC | PRN
  Administered 2023-12-05: 5 mg via ORAL
  Filled 2023-12-04: qty 1

## 2023-12-04 MED ORDER — ONDANSETRON HCL 4 MG/2ML IJ SOLN: 4.0000 mg | Freq: Once | INTRAMUSCULAR | Status: DC | PRN

## 2023-12-04 SURGICAL SUPPLY — 35 items
BENZOIN TINCTURE PRP APPL 2/3 (GAUZE/BANDAGES/DRESSINGS) IMPLANT
BNDG ADH 1X3 SHEER STRL LF (GAUZE/BANDAGES/DRESSINGS) IMPLANT
CHLORAPREP W/TINT 26 (MISCELLANEOUS) ×1 IMPLANT
CLIP APPLIE 5 13 M/L LIGAMAX5 (MISCELLANEOUS) IMPLANT
COVER SURGICAL LIGHT HANDLE (MISCELLANEOUS) ×1 IMPLANT
DERMABOND ADVANCED .7 DNX12 (GAUZE/BANDAGES/DRESSINGS) ×1 IMPLANT
DEVICE SECURE STRAP 25 ABSORB (INSTRUMENTS) ×1 IMPLANT
DEVICE TROCAR PUNCTURE CLOSURE (ENDOMECHANICALS) ×1 IMPLANT
ELECT REM PT RETURN 15FT ADLT (MISCELLANEOUS) ×1 IMPLANT
GLOVE BIO SURGEON STRL SZ7 (GLOVE) ×1 IMPLANT
GLOVE BIOGEL PI IND STRL 7.0 (GLOVE) ×1 IMPLANT
GLOVE SURG SS PI 7.0 STRL IVOR (GLOVE) ×1 IMPLANT
GOWN STRL REUS W/ TWL LRG LVL3 (GOWN DISPOSABLE) ×1 IMPLANT
GOWN STRL REUS W/ TWL XL LVL3 (GOWN DISPOSABLE) IMPLANT
IRRIGATION SUCT STRKRFLW 2 WTP (MISCELLANEOUS) IMPLANT
KIT BASIN OR (CUSTOM PROCEDURE TRAY) ×1 IMPLANT
KIT TURNOVER KIT A (KITS) ×1 IMPLANT
MARKER SKIN DUAL TIP RULER LAB (MISCELLANEOUS) ×1 IMPLANT
MESH 3DMAX MID 4X6 LT LRG (Mesh General) IMPLANT
MESH 3DMAX MID 4X6 RT LRG (Mesh General) IMPLANT
SCISSORS LAP 5X35 DISP (ENDOMECHANICALS) IMPLANT
SET TUBE SMOKE EVAC HIGH FLOW (TUBING) ×1 IMPLANT
SLEEVE Z-THREAD 5X100MM (TROCAR) ×1 IMPLANT
SPIKE FLUID TRANSFER (MISCELLANEOUS) ×1 IMPLANT
STRIP CLOSURE SKIN 1/2X4 (GAUZE/BANDAGES/DRESSINGS) IMPLANT
SUT MNCRL AB 4-0 PS2 18 (SUTURE) ×1 IMPLANT
SUT STRATAFIX SPIRAL PDS3-0 (SUTURE) ×1 IMPLANT
SUT VIC AB 2-0 SH 27X BRD (SUTURE) ×1 IMPLANT
SUT VICRYL 0 UR6 27IN ABS (SUTURE) ×1 IMPLANT
SUTURE STRATFX SPIRAL 3-0 PDS+ (SUTURE) IMPLANT
TOWEL OR DSP ST BLU DLX 10/PK (DISPOSABLE) ×1 IMPLANT
TRAY FOLEY MTR SLVR 16FR STAT (SET/KITS/TRAYS/PACK) ×1 IMPLANT
TRAY LAPAROSCOPIC (CUSTOM PROCEDURE TRAY) ×1 IMPLANT
TROCAR KII 8X100ML NONTHREADED (TROCAR) ×1 IMPLANT
TROCAR Z-THREAD OPTICAL 5X100M (TROCAR) ×1 IMPLANT

## 2023-12-04 NOTE — Plan of Care (Signed)

## 2023-12-04 NOTE — Op Note (Signed)
 Preop diagnosis: bilateral inguinal hernia  Postop diagnosis: bilateral inguinal hernia  Procedure: Laparoscopic  Bilateral inguinal hernia repair with mesh  Surgeon: Herlene Bureau, M.D.  Asst: none  Anesthesia: Gen.   Indications for procedure: Isaac Barnes is a 73 y.o. male with symptoms of pain and enlarging Bilateral inguinal hernia(s). After discussing risks, alternatives and benefits he decided on laparoscopic repair and was brought to day surgery for repair.  Description of procedure: The patient was brought into the operative suite, placed supine. Anesthesia was administered with endotracheal tube. Patient was strapped in place. The patient was prepped and draped in the usual sterile fashion.  A small transverse incision was made in the left subcostal region. A 5mm trocar was used to gain access to the peritoneal cavity by optical entry technique. Pneumoperitoneum was applied with a high flow and low pressure. The laparoscope was reinserted to confirm position.  Next, Marcaine  was used for bilateral TAP blocks. 1 5 mm trocar was placed in the right mid abdomen. 1 8 mm trocar was placed epigastric midline. The patient was placed in trendelenberg position.  On initial visualization , there was a moderate right direct inguinal hernia and a left large indirect inguinal hernia. A peritoneal flap was created on the left. This was continued medially to the pubic bone medially and laterally to muscles. Hernia sac was completely dissected out of the canal. Vas deference and contents of the cord were safely dissected away of the hernia sac.   A peritoneal flap was created on the right. This was continued medially to the pubic bone medially and laterally to muscles. Hernia sac was completely dissected out of the canal. Vas deference and contents of the cord were safely dissected away of the hernia sac.   A large left mid-weight 3D max mesh was inserted and sutured with 2-0 vicryl medially  to the lacunar ligament. The mesh was positioned flat and directly up against the direct and indirect areas. The peritoneal flap was sewn back up with running 3-0 strattafix suture.    A large right mid-weight 3D max mesh was inserted and sutured with 2-0 vicryl medially to the lacunar ligament. The mesh was positioned flat and directly up against the direct and indirect areas. The peritoneal flap was sewn back up with running 3-0 strattafix suture. The CO2 was evacuated while watching to ensure the mesh did not migrate. All skin incisions were closed with 4-0 monocryl subcu stitch. The patient awoke from anesthesia and was brought to PACU in stable condition.  Findings: moderate right direct inguinal hernia, large left indirect inguinal hernia  Specimen: none  Blood loss: 20 ml  Local anesthesia: 30 ml  Marcaine   Complications: none  Implant: large left and right midweight Bard 3D max mesh  Herlene Bureau, M.D. General, Bariatric, & Minimally Invasive Surgery [facility], [facility] 1:39 PM 12/04/2023

## 2023-12-04 NOTE — H&P (Signed)
 Chief Complaint  Patient presents with  New Consultation   Subjective   Isaac Barnes is a 73 y.o. male new patient in today for: He has had a known hernia for several years. This year is has gotten a little bigger and is more symptomatic. He has pain with movement and certain positions. He denies nausea, vomiting, or bowel habit changes. He does smoke daily.  Social Drivers of Health with Concerns   Tobacco Use: High Risk (11/02/2023)  Patient History  Smoking Tobacco Use: Every Day  Smokeless Tobacco Use: Never  Housing Stability: Unknown (11/02/2023)  Housing Stability Vital Sign  Homeless in the Last Year: No    No data to display      Outpatient Medications Prior to Visit  Medication Sig Dispense Refill  oxyCODONE -acetaminophen  (PERCOCET) 10-325 mg tablet Take 1 tablet by mouth every 4 (four) hours as needed   No facility-administered medications prior to visit.    Objective   Vitals:  11/02/23 1403 11/02/23 1404  BP: (!) 172/76  Pulse: 89  Temp: 37.1 C (98.7 F)  SpO2: 98%  Weight: 59.3 kg (130 lb 11.2 oz)  Height: 165.1 cm (5' 5)  PainSc: 7  PainLoc: Groin   Body mass index is 21.75 kg/m. Physical Exam Constitutional:  Appearance: Normal appearance.  HENT:  Head: Normocephalic and atraumatic.  Pulmonary:  Effort: Pulmonary effort is normal.  Abdominal:  Comments: Moderate right hernia, small left hernia  Musculoskeletal:  General: Normal range of motion.  Cervical back: Normal range of motion.  Neurological:  General: No focal deficit present.  Mental Status: He is alert and oriented to person, place, and time. Mental status is at baseline.  Psychiatric:  Mood and Affect: Mood normal.  Behavior: Behavior normal.  Thought Content: Thought content normal.     I reviewed notes by Gatha Knoll  Assessment/Plan:  73 yo male with symptomatic right inguinal hernia and developing left inguinal hernia. We discussed etiology of hernias and how  they can cause pain. We discussed options for inguinal hernia repair vs observation. We discussed details of the surgery of general anesthesia, surgical approach and incisions, dissecting the sack away from vas deference, testicular vessels and nerves and placement of mesh. We discussed risks of bleeding, infection, recurrence, injury to vas deference, testicular vessels, nerve injury, and chronic pain. He showed good understanding and wanted proceed with open bilateral inguinal hernia repair.   Today he has some confusion and on further discussion notes being on chronic narcotics begin filled by Dr. Gatha Fuss. After several conversations he notes taking one this morning. Due to concerns, we will observe him overnight.  Diagnoses and all orders for this visit:  Non-recurrent bilateral inguinal hernia without obstruction or gangrene  Tobacco use

## 2023-12-04 NOTE — Anesthesia Postprocedure Evaluation (Signed)
 Anesthesia Post Note  Patient: Isaac Barnes  Procedure(s) Performed: REPAIR, HERNIA, INGUINAL, LAPAROSCOPIC BILATERAL (Bilateral)     Patient location during evaluation: PACU Anesthesia Type: General Level of consciousness: awake Pain management: pain level controlled Vital Signs Assessment: post-procedure vital signs reviewed and stable Respiratory status: spontaneous breathing, nonlabored ventilation and respiratory function stable Cardiovascular status: blood pressure returned to baseline and stable Postop Assessment: no apparent nausea or vomiting Anesthetic complications: no   No notable events documented.  Last Vitals:  Vitals:   12/04/23 1536 12/04/23 1823  BP: 134/77 106/82  Pulse: 86 70  Resp: 14 16  Temp: 36.7 C 36.8 C  SpO2: 93% 95%    Last Pain:  Vitals:   12/04/23 1823  TempSrc: Oral  PainSc:                  [name]

## 2023-12-04 NOTE — Transfer of Care (Signed)
 Immediate Anesthesia Transfer of Care Note  Patient: Isaac Barnes  Procedure(s) Performed: REPAIR, HERNIA, INGUINAL, LAPAROSCOPIC BILATERAL (Bilateral)  Patient Location: PACU  Anesthesia Type:General  Level of Consciousness: awake  Airway & Oxygen Therapy: Patient Spontanous Breathing and Patient connected to nasal cannula oxygen  Post-op Assessment: Report given to RN and Post -op Vital signs reviewed and stable  Post vital signs: Reviewed and stable  Last Vitals:  Vitals Value Taken Time  BP    Temp    Pulse    Resp    SpO2      Last Pain:  Vitals:   12/04/23 1036  TempSrc:   PainSc: 10-Worst pain ever         Complications: No notable events documented.

## 2023-12-04 NOTE — Anesthesia Procedure Notes (Signed)
 Procedure Name: Intubation Date/Time: 12/04/2023 12:36 PM  Performed by: Belvie Valri NOVAK, CRNAPre-anesthesia Checklist: Patient identified, Emergency Drugs available, Suction available and Patient being monitored Patient Re-evaluated:Patient Re-evaluated prior to induction Oxygen Delivery Method: Circle System Utilized Preoxygenation: Pre-oxygenation with 100% oxygen Induction Type: IV induction and Rapid sequence Laryngoscope Size: Mac and 4 Grade View: Grade I Tube type: Oral Tube size: 7.5 mm Number of attempts: 1 Airway Equipment and Method: Stylet and Oral airway Placement Confirmation: ETT inserted through vocal cords under direct vision, positive ETCO2 and breath sounds checked- equal and bilateral Secured at: 22 cm Tube secured with: Tape Dental Injury: Teeth and Oropharynx as per pre-operative assessment

## 2023-12-04 NOTE — Anesthesia Preprocedure Evaluation (Addendum)
 Anesthesia Evaluation  Patient identified by MRN, date of birth, ID band Patient awake    Reviewed: Allergy & Precautions, NPO status , Patient's Chart, lab work & pertinent test results  Airway Mallampati: II  TM Distance: >3 FB Neck ROM: Full    Dental  (+) Upper Dentures, Lower Dentures   Pulmonary former smoker   Pulmonary exam normal        Cardiovascular negative cardio ROS Normal cardiovascular exam     Neuro/Psych  Headaches PSYCHIATRIC DISORDERS  Depression     Neuromuscular disease    GI/Hepatic negative GI ROS,,,(+)     substance abuse    Endo/Other  negative endocrine ROS    Renal/GU negative Renal ROS     Musculoskeletal  (+)  narcotic dependent  Abdominal   Peds  Hematology Follicular lymphoma s/p chemo   Anesthesia Other Findings BILATERAL INGUINAL HERNIA  Reproductive/Obstetrics                              Anesthesia Physical Anesthesia Plan  ASA: 2  Anesthesia Plan: General   Post-op Pain Management:    Induction: Intravenous  PONV Risk Score and Plan: 2 and Ondansetron , Dexamethasone  and Treatment may vary due to age or medical condition  Airway Management Planned: Oral ETT  Additional Equipment:   Intra-op Plan:   Post-operative Plan: Extubation in OR  Informed Consent: I have reviewed the patients History and Physical, chart, labs and discussed the procedure including the risks, benefits and alternatives for the proposed anesthesia with the patient or authorized representative who has indicated his/her understanding and acceptance.     Dental advisory given  Plan Discussed with: CRNA  Anesthesia Plan Comments:          Anesthesia Quick Evaluation

## 2023-12-05 ENCOUNTER — Encounter (HOSPITAL_COMMUNITY): Payer: Self-pay | Admitting: General Surgery

## 2023-12-05 DIAGNOSIS — Z87891 Personal history of nicotine dependence: Secondary | ICD-10-CM | POA: Diagnosis not present

## 2023-12-05 DIAGNOSIS — K4020 Bilateral inguinal hernia, without obstruction or gangrene, not specified as recurrent: Secondary | ICD-10-CM | POA: Diagnosis not present

## 2023-12-05 LAB — TROPONIN T, HIGH SENSITIVITY: Troponin T High Sensitivity: 16 ng/L (ref 0–19)

## 2023-12-05 MED ORDER — NITROGLYCERIN 0.4 MG SL SUBL
0.4000 mg | SUBLINGUAL_TABLET | SUBLINGUAL | Status: DC | PRN
  Administered 2023-12-05 (×2): 0.4 mg via SUBLINGUAL
  Filled 2023-12-05: qty 1

## 2023-12-05 MED ORDER — OXYCODONE HCL 5 MG PO TABS: 5.0000 mg | ORAL_TABLET | Freq: Four times a day (QID) | ORAL | 0 refills | Status: AC | PRN

## 2023-12-05 MED ORDER — CELECOXIB 200 MG PO CAPS: 200.0000 mg | ORAL_CAPSULE | Freq: Two times a day (BID) | ORAL | 0 refills | Status: AC

## 2023-12-05 MED ORDER — ALUM & MAG HYDROXIDE-SIMETH 200-200-20 MG/5ML PO SUSP
30.0000 mL | Freq: Four times a day (QID) | ORAL | Status: DC | PRN
  Administered 2023-12-05 (×2): 30 mL via ORAL
  Filled 2023-12-05 (×2): qty 30

## 2023-12-05 NOTE — Significant Event (Signed)
 Rapid Response Event Note   Reason for Call :  Radiating chest pain   Initial Focused Assessment:  Patient A/O X 4 states chest pain developed over night with minimal relief from MAALOX. Patient states pain is now sharp in his clavicle and running down his right arm causing numbness to his right hand. Patient stated to bedside nurse he was feeling short of breath. Patient's respirations are normal and non-labored currently but patient does state he feels short of breath. All vital signs are WNL and are in flowsheets.   Interventions:  MAALOX- no relief  0.4 Nitroglycerin sublingual- relief of chest pain but not arm pain EKG obtain and in chart, negative for STEMI STAT troponin's Plan of Care:  Patient due to discharge, await troponin labs to come back and notify MD results along with patients condition.  If chest pain returns RN may give nitroglycerin again and notify MD. MD Notified: Herlene Bureau MD Call Time: 0930 Arrival Time: 9064 End Time: 1013  Isaac LILLETTE Settles, RN

## 2023-12-05 NOTE — Discharge Summary (Signed)
  Physician Discharge Summary  Isaac Barnes FMW:[id number] DOB: 17-Apr-1950 DOA: 12/04/2023  PCP: Sim Emery CROME, MD  Admit date: 12/04/2023 Discharge date:  12/05/2023   Recommendations for Outpatient Follow-up:   He has a chronic narcotic use being prescribed 150 perc 10s more than monthly. He presented to preop with concerns for opioid euphoria and confusion. If he continues to have diffuse pains or withdrawal, pain clinic referral or suboxone would be a better choice for him. (include homehealth, outpatient follow-up instructions, specific recommendations for PCP to follow-up on, etc.)   Discharge Diagnoses:  Principal Problem:   Bilateral inguinal hernia   Surgical Procedure: lap bilateral inguinal hernia repair with mesh  Discharge Condition: Good Disposition: Home  Diet recommendation: reg diet   Hospital Course:  73 yo male presented for lap hernia repairs. Due to some somnolence he was watched overnight post op. He did well and was discharged home POD 1.  Discharge Instructions  Discharge Instructions     Diet - low sodium heart healthy   Complete by: As directed    Discharge wound care:   Complete by: As directed    Shower normal tomorrow. Glue to stay on for 10-14 days. No bandage needed.   Increase activity slowly   Complete by: As directed            The results of significant diagnostics from this hospitalization (including imaging, microbiology, ancillary and laboratory) are listed below for reference.    Significant Diagnostic Studies: No results found.  Labs: Basic Metabolic Panel: Recent Labs  Lab 12/04/23 1601  CREATININE 1.29*   Liver Function Tests: No results for input(s): AST, ALT, ALKPHOS, BILITOT, PROT, ALBUMIN in the last 168 hours.  CBC: Recent Labs  Lab 11/28/23 1440 12/04/23 1601  WBC 6.4 8.1  HGB 13.6 12.2*  HCT 40.0 36.4*  MCV 89.5 90.5  PLT 158 124*    CBG: Recent Labs  Lab 12/04/23 1132   GLUCAP 125*    Principal Problem:   Bilateral inguinal hernia   Time coordinating discharge: 15 min

## 2023-12-05 NOTE — Plan of Care (Signed)
   Problem: Education: Goal: Knowledge of General Education information will improve Description Including pain rating scale, medication(s)/side effects and non-pharmacologic comfort measures Outcome: Progressing   Problem: Health Behavior/Discharge Planning: Goal: Ability to manage health-related needs will improve Outcome: Progressing

## 2023-12-05 NOTE — Discharge Instructions (Signed)

## 2023-12-05 NOTE — Progress Notes (Signed)
   12/05/23 1133  TOC Brief Assessment  Insurance and Status Reviewed  Patient has primary care physician Yes  Home environment has been reviewed resides in a private residence  Prior level of function: Independent  Prior/Current Home Services No current home services  Social Drivers of Health Review SDOH reviewed no interventions necessary  Readmission risk has been reviewed Yes  Transition of care needs no transition of care needs at this time
